# Patient Record
Sex: Female | Born: 1950
Health system: Southern US, Community
[De-identification: ages and names within clinical notes are randomized; demographics above are authoritative.]

## PROBLEM LIST (undated history)

## (undated) DIAGNOSIS — R7303 Prediabetes: Secondary | ICD-10-CM

## (undated) DIAGNOSIS — R519 Headache, unspecified: Secondary | ICD-10-CM

## (undated) DIAGNOSIS — F411 Generalized anxiety disorder: Secondary | ICD-10-CM

## (undated) DIAGNOSIS — E039 Hypothyroidism, unspecified: Secondary | ICD-10-CM

## (undated) DIAGNOSIS — K219 Gastro-esophageal reflux disease without esophagitis: Secondary | ICD-10-CM

## (undated) DIAGNOSIS — E119 Type 2 diabetes mellitus without complications: Secondary | ICD-10-CM

## (undated) DIAGNOSIS — N189 Chronic kidney disease, unspecified: Secondary | ICD-10-CM

## (undated) DIAGNOSIS — R251 Tremor, unspecified: Secondary | ICD-10-CM

## (undated) DIAGNOSIS — C801 Malignant (primary) neoplasm, unspecified: Secondary | ICD-10-CM

## (undated) DIAGNOSIS — J189 Pneumonia, unspecified organism: Secondary | ICD-10-CM

## (undated) DIAGNOSIS — J45909 Unspecified asthma, uncomplicated: Secondary | ICD-10-CM

## (undated) DIAGNOSIS — I10 Essential (primary) hypertension: Secondary | ICD-10-CM

## (undated) DIAGNOSIS — R51 Headache: Secondary | ICD-10-CM

## (undated) DIAGNOSIS — F419 Anxiety disorder, unspecified: Secondary | ICD-10-CM

## (undated) DIAGNOSIS — M1990 Unspecified osteoarthritis, unspecified site: Secondary | ICD-10-CM

## (undated) DIAGNOSIS — K2270 Barrett's esophagus without dysplasia: Secondary | ICD-10-CM

## (undated) HISTORY — DX: Gastro-esophageal reflux disease without esophagitis: K21.9

## (undated) HISTORY — DX: Hypothyroidism, unspecified: E03.9

## (undated) HISTORY — DX: Anxiety disorder, unspecified: F41.9

## (undated) HISTORY — DX: Chronic kidney disease, unspecified: N18.9

## (undated) HISTORY — DX: Tremor, unspecified: R25.1

## (undated) HISTORY — DX: Headache, unspecified: R51.9

## (undated) HISTORY — PX: TONSILLECTOMY: SUR1361

## (undated) HISTORY — PX: ESOPHAGOGASTRODUODENOSCOPY ENDOSCOPY: SHX5814

## (undated) HISTORY — PX: COLONOSCOPY: SHX174

## (undated) HISTORY — DX: Generalized anxiety disorder: F41.1

## (undated) HISTORY — DX: Unspecified asthma, uncomplicated: J45.909

## (undated) HISTORY — DX: Essential (primary) hypertension: I10

## (undated) HISTORY — DX: Headache: R51

## (undated) HISTORY — DX: Type 2 diabetes mellitus without complications: E11.9

## (undated) HISTORY — DX: Barrett's esophagus without dysplasia: K22.70

## (undated) SURGERY — Surgical Case
Anesthesia: *Unknown

---

## 1996-10-20 HISTORY — PX: CHOLECYSTECTOMY: SHX55

## 2007-06-16 ENCOUNTER — Ambulatory Visit: Payer: Self-pay | Admitting: Oncology

## 2014-11-20 DIAGNOSIS — Z01419 Encounter for gynecological examination (general) (routine) without abnormal findings: Secondary | ICD-10-CM | POA: Diagnosis not present

## 2014-11-20 DIAGNOSIS — M109 Gout, unspecified: Secondary | ICD-10-CM | POA: Diagnosis not present

## 2015-01-05 DIAGNOSIS — M25569 Pain in unspecified knee: Secondary | ICD-10-CM | POA: Diagnosis not present

## 2015-01-22 DIAGNOSIS — H6983 Other specified disorders of Eustachian tube, bilateral: Secondary | ICD-10-CM | POA: Diagnosis not present

## 2015-02-26 DIAGNOSIS — M1712 Unilateral primary osteoarthritis, left knee: Secondary | ICD-10-CM | POA: Diagnosis not present

## 2015-02-26 DIAGNOSIS — M1711 Unilateral primary osteoarthritis, right knee: Secondary | ICD-10-CM | POA: Diagnosis not present

## 2015-02-26 DIAGNOSIS — M170 Bilateral primary osteoarthritis of knee: Secondary | ICD-10-CM | POA: Diagnosis not present

## 2015-03-01 DIAGNOSIS — Z1389 Encounter for screening for other disorder: Secondary | ICD-10-CM | POA: Diagnosis not present

## 2015-03-01 DIAGNOSIS — Z9181 History of falling: Secondary | ICD-10-CM | POA: Diagnosis not present

## 2015-03-01 DIAGNOSIS — N951 Menopausal and female climacteric states: Secondary | ICD-10-CM | POA: Diagnosis not present

## 2015-03-28 DIAGNOSIS — R0602 Shortness of breath: Secondary | ICD-10-CM | POA: Diagnosis not present

## 2015-03-28 DIAGNOSIS — J40 Bronchitis, not specified as acute or chronic: Secondary | ICD-10-CM | POA: Diagnosis not present

## 2015-03-29 DIAGNOSIS — R0609 Other forms of dyspnea: Secondary | ICD-10-CM | POA: Diagnosis not present

## 2015-03-29 DIAGNOSIS — R0600 Dyspnea, unspecified: Secondary | ICD-10-CM | POA: Diagnosis not present

## 2015-04-03 DIAGNOSIS — F411 Generalized anxiety disorder: Secondary | ICD-10-CM | POA: Diagnosis not present

## 2015-04-06 DIAGNOSIS — N179 Acute kidney failure, unspecified: Secondary | ICD-10-CM | POA: Diagnosis not present

## 2015-04-06 DIAGNOSIS — E119 Type 2 diabetes mellitus without complications: Secondary | ICD-10-CM | POA: Diagnosis present

## 2015-04-06 DIAGNOSIS — R031 Nonspecific low blood-pressure reading: Secondary | ICD-10-CM | POA: Diagnosis not present

## 2015-04-06 DIAGNOSIS — Z79899 Other long term (current) drug therapy: Secondary | ICD-10-CM | POA: Diagnosis not present

## 2015-04-06 DIAGNOSIS — M1A3690 Chronic gout due to renal impairment, unspecified knee, without tophus (tophi): Secondary | ICD-10-CM | POA: Diagnosis present

## 2015-04-06 DIAGNOSIS — Z888 Allergy status to other drugs, medicaments and biological substances status: Secondary | ICD-10-CM | POA: Diagnosis not present

## 2015-04-06 DIAGNOSIS — R55 Syncope and collapse: Secondary | ICD-10-CM | POA: Diagnosis not present

## 2015-04-06 DIAGNOSIS — E871 Hypo-osmolality and hyponatremia: Secondary | ICD-10-CM | POA: Diagnosis not present

## 2015-04-06 DIAGNOSIS — E869 Volume depletion, unspecified: Secondary | ICD-10-CM | POA: Diagnosis present

## 2015-04-06 DIAGNOSIS — N183 Chronic kidney disease, stage 3 (moderate): Secondary | ICD-10-CM | POA: Diagnosis present

## 2015-04-06 DIAGNOSIS — Z880 Allergy status to penicillin: Secondary | ICD-10-CM | POA: Diagnosis not present

## 2015-04-06 DIAGNOSIS — E860 Dehydration: Secondary | ICD-10-CM | POA: Diagnosis not present

## 2015-04-06 DIAGNOSIS — I959 Hypotension, unspecified: Secondary | ICD-10-CM | POA: Diagnosis not present

## 2015-04-06 DIAGNOSIS — J069 Acute upper respiratory infection, unspecified: Secondary | ICD-10-CM | POA: Diagnosis not present

## 2015-04-06 DIAGNOSIS — I129 Hypertensive chronic kidney disease with stage 1 through stage 4 chronic kidney disease, or unspecified chronic kidney disease: Secondary | ICD-10-CM | POA: Diagnosis not present

## 2015-04-06 DIAGNOSIS — E039 Hypothyroidism, unspecified: Secondary | ICD-10-CM | POA: Diagnosis present

## 2015-04-16 DIAGNOSIS — R300 Dysuria: Secondary | ICD-10-CM | POA: Diagnosis not present

## 2015-04-16 DIAGNOSIS — N189 Chronic kidney disease, unspecified: Secondary | ICD-10-CM | POA: Diagnosis not present

## 2015-04-16 DIAGNOSIS — S37009A Unspecified injury of unspecified kidney, initial encounter: Secondary | ICD-10-CM | POA: Diagnosis not present

## 2015-04-16 DIAGNOSIS — E860 Dehydration: Secondary | ICD-10-CM | POA: Diagnosis not present

## 2015-05-07 DIAGNOSIS — I10 Essential (primary) hypertension: Secondary | ICD-10-CM | POA: Diagnosis not present

## 2015-05-11 DIAGNOSIS — M25569 Pain in unspecified knee: Secondary | ICD-10-CM | POA: Diagnosis not present

## 2015-05-24 DIAGNOSIS — D510 Vitamin B12 deficiency anemia due to intrinsic factor deficiency: Secondary | ICD-10-CM | POA: Diagnosis not present

## 2015-05-29 DIAGNOSIS — Z1231 Encounter for screening mammogram for malignant neoplasm of breast: Secondary | ICD-10-CM | POA: Diagnosis not present

## 2015-06-11 DIAGNOSIS — M25562 Pain in left knee: Secondary | ICD-10-CM | POA: Diagnosis not present

## 2015-06-29 DIAGNOSIS — E119 Type 2 diabetes mellitus without complications: Secondary | ICD-10-CM | POA: Diagnosis not present

## 2015-06-29 DIAGNOSIS — E039 Hypothyroidism, unspecified: Secondary | ICD-10-CM | POA: Diagnosis not present

## 2015-06-29 DIAGNOSIS — Z79899 Other long term (current) drug therapy: Secondary | ICD-10-CM | POA: Diagnosis not present

## 2015-07-27 DIAGNOSIS — D510 Vitamin B12 deficiency anemia due to intrinsic factor deficiency: Secondary | ICD-10-CM | POA: Diagnosis not present

## 2015-08-06 DIAGNOSIS — G43009 Migraine without aura, not intractable, without status migrainosus: Secondary | ICD-10-CM | POA: Diagnosis not present

## 2015-08-16 DIAGNOSIS — Z23 Encounter for immunization: Secondary | ICD-10-CM | POA: Diagnosis not present

## 2015-09-03 DIAGNOSIS — E119 Type 2 diabetes mellitus without complications: Secondary | ICD-10-CM | POA: Diagnosis not present

## 2015-09-03 DIAGNOSIS — N183 Chronic kidney disease, stage 3 (moderate): Secondary | ICD-10-CM | POA: Diagnosis not present

## 2015-09-03 DIAGNOSIS — M150 Primary generalized (osteo)arthritis: Secondary | ICD-10-CM | POA: Diagnosis not present

## 2015-09-11 DIAGNOSIS — D510 Vitamin B12 deficiency anemia due to intrinsic factor deficiency: Secondary | ICD-10-CM | POA: Diagnosis not present

## 2015-10-15 DIAGNOSIS — R509 Fever, unspecified: Secondary | ICD-10-CM | POA: Diagnosis not present

## 2015-10-15 DIAGNOSIS — R05 Cough: Secondary | ICD-10-CM | POA: Diagnosis not present

## 2015-10-17 DIAGNOSIS — J189 Pneumonia, unspecified organism: Secondary | ICD-10-CM | POA: Diagnosis not present

## 2015-10-23 DIAGNOSIS — E860 Dehydration: Secondary | ICD-10-CM | POA: Diagnosis not present

## 2015-10-23 DIAGNOSIS — J189 Pneumonia, unspecified organism: Secondary | ICD-10-CM | POA: Diagnosis not present

## 2015-10-23 DIAGNOSIS — N179 Acute kidney failure, unspecified: Secondary | ICD-10-CM | POA: Diagnosis not present

## 2015-10-23 DIAGNOSIS — R509 Fever, unspecified: Secondary | ICD-10-CM | POA: Diagnosis not present

## 2015-10-24 DIAGNOSIS — E860 Dehydration: Secondary | ICD-10-CM | POA: Diagnosis not present

## 2015-10-24 DIAGNOSIS — J329 Chronic sinusitis, unspecified: Secondary | ICD-10-CM | POA: Diagnosis not present

## 2015-10-24 DIAGNOSIS — N179 Acute kidney failure, unspecified: Secondary | ICD-10-CM | POA: Diagnosis not present

## 2015-10-25 DIAGNOSIS — J329 Chronic sinusitis, unspecified: Secondary | ICD-10-CM | POA: Diagnosis not present

## 2015-10-26 DIAGNOSIS — D510 Vitamin B12 deficiency anemia due to intrinsic factor deficiency: Secondary | ICD-10-CM | POA: Diagnosis not present

## 2015-10-26 DIAGNOSIS — J329 Chronic sinusitis, unspecified: Secondary | ICD-10-CM | POA: Diagnosis not present

## 2015-10-29 DIAGNOSIS — J329 Chronic sinusitis, unspecified: Secondary | ICD-10-CM | POA: Diagnosis not present

## 2015-10-30 DIAGNOSIS — J329 Chronic sinusitis, unspecified: Secondary | ICD-10-CM | POA: Diagnosis not present

## 2015-11-23 DIAGNOSIS — N390 Urinary tract infection, site not specified: Secondary | ICD-10-CM | POA: Diagnosis not present

## 2015-12-13 DIAGNOSIS — S61259A Open bite of unspecified finger without damage to nail, initial encounter: Secondary | ICD-10-CM | POA: Diagnosis not present

## 2015-12-13 DIAGNOSIS — L089 Local infection of the skin and subcutaneous tissue, unspecified: Secondary | ICD-10-CM | POA: Diagnosis not present

## 2015-12-13 DIAGNOSIS — W5501XA Bitten by cat, initial encounter: Secondary | ICD-10-CM | POA: Diagnosis not present

## 2015-12-13 DIAGNOSIS — S61432A Puncture wound without foreign body of left hand, initial encounter: Secondary | ICD-10-CM | POA: Diagnosis not present

## 2015-12-28 DIAGNOSIS — J342 Deviated nasal septum: Secondary | ICD-10-CM | POA: Diagnosis not present

## 2015-12-28 DIAGNOSIS — J329 Chronic sinusitis, unspecified: Secondary | ICD-10-CM | POA: Diagnosis not present

## 2015-12-28 DIAGNOSIS — R05 Cough: Secondary | ICD-10-CM | POA: Diagnosis not present

## 2015-12-28 DIAGNOSIS — J343 Hypertrophy of nasal turbinates: Secondary | ICD-10-CM | POA: Diagnosis not present

## 2015-12-28 DIAGNOSIS — J069 Acute upper respiratory infection, unspecified: Secondary | ICD-10-CM | POA: Diagnosis not present

## 2015-12-28 DIAGNOSIS — R51 Headache: Secondary | ICD-10-CM | POA: Diagnosis not present

## 2015-12-28 DIAGNOSIS — R0981 Nasal congestion: Secondary | ICD-10-CM | POA: Diagnosis not present

## 2016-01-01 DIAGNOSIS — J329 Chronic sinusitis, unspecified: Secondary | ICD-10-CM | POA: Diagnosis not present

## 2016-01-01 DIAGNOSIS — J342 Deviated nasal septum: Secondary | ICD-10-CM | POA: Diagnosis not present

## 2016-01-04 DIAGNOSIS — R112 Nausea with vomiting, unspecified: Secondary | ICD-10-CM | POA: Diagnosis not present

## 2016-01-04 DIAGNOSIS — K219 Gastro-esophageal reflux disease without esophagitis: Secondary | ICD-10-CM | POA: Diagnosis not present

## 2016-01-10 DIAGNOSIS — R112 Nausea with vomiting, unspecified: Secondary | ICD-10-CM | POA: Diagnosis not present

## 2016-01-11 DIAGNOSIS — R51 Headache: Secondary | ICD-10-CM | POA: Diagnosis not present

## 2016-01-11 DIAGNOSIS — J342 Deviated nasal septum: Secondary | ICD-10-CM | POA: Diagnosis not present

## 2016-01-11 DIAGNOSIS — R05 Cough: Secondary | ICD-10-CM | POA: Diagnosis not present

## 2016-01-11 DIAGNOSIS — K219 Gastro-esophageal reflux disease without esophagitis: Secondary | ICD-10-CM | POA: Diagnosis not present

## 2016-01-14 ENCOUNTER — Ambulatory Visit (INDEPENDENT_AMBULATORY_CARE_PROVIDER_SITE_OTHER): Payer: Medicare Other | Admitting: Pulmonary Disease

## 2016-01-14 ENCOUNTER — Encounter: Payer: Self-pay | Admitting: Pulmonary Disease

## 2016-01-14 VITALS — BP 124/84 | HR 79 | Temp 96.9°F

## 2016-01-14 DIAGNOSIS — R05 Cough: Secondary | ICD-10-CM | POA: Diagnosis not present

## 2016-01-14 DIAGNOSIS — K219 Gastro-esophageal reflux disease without esophagitis: Secondary | ICD-10-CM

## 2016-01-14 DIAGNOSIS — R053 Chronic cough: Secondary | ICD-10-CM

## 2016-01-14 DIAGNOSIS — J387 Other diseases of larynx: Secondary | ICD-10-CM

## 2016-01-14 MED ORDER — OMEPRAZOLE 40 MG PO CPDR: 40.0000 mg | DELAYED_RELEASE_CAPSULE | Freq: Two times a day (BID) | ORAL | Status: AC

## 2016-01-14 NOTE — Patient Instructions (Signed)
Mee-- it was great meeting you today...  With your min remote smoking history, clear CXRs in Dec & Jan, normal spirometry & oximetry tests>    It is unlikely that you have any serious underlying lung disease...  The most likely cause of your refractory cough with severe paroxysms that lead to vomiting is a type of airway inflammation related to reflux that often occurs at night...  We are going to place you on a strict ANTIREFLUX regimen for the next 6wks-    1) take the OMEPRAZOLE 40mg  TWICE daily= 30 min before breakfast & dinner in the eve (it is important to eat at least a little bit after the 39minutes)    2) DO NOT EAT OR DRINK AFTER DINNER IN THE EVENING    3) ELEVATE THE HEAD OF YOUR BED ON ^" BLOCKS  Continue to use the SYMBICORT- 2 sprays twice daily for now (and rinse out your mouth after each treatment)  Continue to use cough drops, throat lozenges, sugarless gum, sip water, etc...  Call for any questions...  Let's plan a follow up visit in 6wks, sooner if needed for problems.Marland KitchenMarland Kitchen

## 2016-01-14 NOTE — Progress Notes (Signed)
Subjective:     Patient ID: Christina Garner, female   DOB: 11-10-1950, 65 y.o.   MRN: [id number]  HPI ~  January 14, 2016:  Initial pulmonary evaluation by SN>   34 y/o WF referred by DrBurkhart, [facility] in [place] for a pulmonary evaluation- she is their PCC>      Christina Garner presents w/ a hx of persistent cough- moderate in severity, present for about 3-63mo and started w/ "a cold" she says; cough occurs in paroxysms that can be severe and lead to vomiting, assoc w/ some thick white mucus, no discoloration or blood, denies SOB, +hoarseness and ENT eval c/w LPR; seen at an urgent care over the holidays- CXR clear, given Rocephin & ZPak;  Then evaluated and treated by DrBurkhart- given Rocephin, Omnicef, Prednisone taper, Levaquin (poorly tolerated), IVF, etc;  Sent to ENT (Dr. Cletis [facility]) earlier this month w/ thorough eval (CT Sinus- neg, and Laryngoscopy w/ signs of LPR) & findings of recurrent URIs, chr facial pain, dev septum, hypertrophy of inferior turbinates, & LPR;  Started on Omep40, antireflux regimen, nasal saline mist...   Smoking Hx>  She is an ex-smoker (smoked 1 pack total in her high school days, min in college, 1ppwk & quit in [place]- at age 63)  Pulmonary Hx>  No real hx of lung dis/ resp problems- no hx asthma but she's had bronchitis once per yr & one prev bout of pneumonia; no hx TB or known exposure and neg PPDs in their medical office...  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  Last CXRs>  10/15/15 & 10/23/15 Film reports state norm heart size, clear lungs, NAD...  CT Sinuses 01/01/16 at [place]> calcif atherosclerosis at skull base, mild mucosal thickening in max sinus, nasal sepatal deviation & spurring, no CT evid of sinusitis...  Spirometry 01/14/16>  FVC=3.35 (104%), FEV1=2.61 (106%), %1sec=78%, mid-flows wnl at 107% predicted... NORMAL SPIROMETRY  Ambulatory Oximetry 01/14/16>  O2sat= 100% on RA at rest w/ pulse=80/min;  She ambulated 3 Laps in the office w/ lowest O2sat=96% w/ pulse=108/min... NO DESATURATION  IMP >>     Chronic cough-- likely upper airway cough syndrome>  OK to continue the Symbicort Bid, use lozenges/ cough drops/ sugarless gum/ OTC cough syrup prn...    LPR>  This was identified by ENT & they started PPI but needs to take this 68min before dinner + a vigorous antireflux regimen (NPO after dinner, elev HOB 6")...  PLAN >>     Christina Garner has had an excellent work up to date but unfortunately min improvement w/ mult antibiotics, Pred taper, Symbicort, etc;  We discussed the upper airway inflamm & it's relation to reflux/ LPR and we reviewed the treatment regimen w/ PPI taken 30 min before dinner, NPO after dinner, elev HOB 6" blocks;  She will use cough drops/ throat lozenges/ sugarless gum/ etc and give  this some time- we plan ROV recheck in 6 weeks...     No past medical history on file.   No past surgical history on file.   Outpatient Encounter Prescriptions as of 01/14/2016  Medication Sig  . ALPRAZolam (XANAX) 1 MG tablet Take 1 mg by mouth at bedtime as needed.  . budesonide-formoterol (SYMBICORT) 160-4.5 MCG/ACT inhaler Inhale 2 puffs into the lungs 2 (two) times daily.  . cyanocobalamin (,VITAMIN B-12,) 1000 MCG/ML injection Inject 1,000 mcg into the muscle every 30 (thirty) days.  . furosemide  (LASIX) 40 MG tablet Take 40 mg by mouth daily as needed.  Marland Kitchen ibuprofen (ADVIL,MOTRIN) 800 MG tablet Take 800 mg by mouth 2 (two) times daily as needed.  Marland Kitchen levothyroxine (SYNTHROID, LEVOTHROID) 112 MCG tablet Take 112 mcg by mouth daily before breakfast.  . promethazine (PHENERGAN) 25 MG tablet Take 25 mg by mouth every 6 (six) hours as needed for nausea or vomiting.  . SUMAtriptan (IMITREX) 100 MG tablet Take 100 mg by mouth every 2 (two) hours as needed for migraine. May repeat in 2 hours if headache persists or recurs.  . valsartan (DIOVAN) 320 MG tablet Take 320 mg by mouth daily.  Marland Kitchen venlafaxine XR (EFFEXOR-XR) 150 MG 24 hr capsule Take 150 mg by mouth daily.  Marland Kitchen omeprazole (PRILOSEC) 40 MG capsule Take 1 capsule (40 mg total) by mouth 2 (two) times daily. 30MINS BEFORE BREAKFAST AND DINNER   No facility-administered encounter medications on file as of 01/14/2016.    Not on File   No family history on file.   Social History   Social History  . Marital Status: Divorced    Spouse Name: N/A  . Number of Children: N/A  . Years of Education: N/A   Occupational History  . Not on file.   Social History Main Topics  . Smoking status: Former Smoker -- 0.50 packs/day for 3 years    Types: Cigarettes    Start date: 10/21/1967    Quit date: 10/20/1969  . Smokeless tobacco: Never Used  . Alcohol Use: Not on file  . Drug Use: Not on file  . Sexual Activity: Not on file   Other Topics Concern  . Not on file   Social History Narrative  . No narrative on file    Current Medications, Allergies, Past Medical History, Past Surgical History, Family History, and Social History were reviewed in [employer] record.   Review of Systems             All symptoms NEG except where BOLDED >>  Constitutional:  F/C/S, fatigue, anorexia, unexpected weight change. HEENT:  HA, visual changes, hearing loss, earache, nasal symptoms, sore throat, mouth sores, hoarseness. Resp:   cough, sputum, hemoptysis; SOB, tightness, wheezing. Cardio:  CP, palpit, DOE, orthopnea, edema. GI:  N/V/D/C, blood in stool; reflux, abd pain, distention, gas. GU:  dysuria, freq, urgency, hematuria, flank pain, voiding difficulty. MS:  joint pain, swelling, tenderness, decr ROM; neck pain, back pain, etc. Neuro:  HA, tremors, seizures, dizziness, syncope, weakness, numbness, gait abn. Skin:  suspicious lesions or skin rash. Heme:  adenopathy, bruising, bleeding. Psyche:  confusion, agitation, sleep disturbance, hallucinations, anxiety, depression suicidal.   Objective:   Physical Exam       Vital Signs:  Reviewed...  General:  WD, overweight, 65 y/o WF in NAD; alert & oriented; pleasant & cooperative... HEENT:  [place]/AT; Conjunctiva- pink, Sclera- nonicteric, EOM-wnl, PERRLA, EACs-clear, TMs-wnl; NOSE-clear; THROAT-sl red w/o exud. Neck:  Supple  w/ fair ROM; no JVD; normal carotid impulses w/o bruits; no thyromegaly or nodules palpated; no lymphadenopathy. Chest:  Clear to P & A; without wheezes, rales, or rhonchi heard. Heart:  Regular Rhythm; norm S1 & S2 without murmurs, rubs, or gallops detected. Abdomen:  Soft & nontender- no guarding or rebound; normal bowel sounds; no organomegaly or masses palpated. Ext:  Decr ROM; without deformities +arthritic changes; no varicose veins, +venous insuffic, no edema;  Pulses intact w/o bruits. Neuro:  CNs II-XII intact; motor testing normal; sensory testing normal; gait normal & balance OK. Derm:  No lesions noted; no rash etc. Lymph:  No cervical, supraclavicular, axillary, or inguinal adenopathy palpated.   Assessment:      IMP >>     Chronic cough-- likely upper airway cough syndrome>  OK to continue the Symbicort Bid, use lozenges/ cough drops/ sugarless gum/ OTC cough syrup prn...    LPR>  This was identified by ENT & they started PPI but needs to take this 66min before dinner + a vigorous antireflux regimen (NPO after dinner, elev HOB  6")...  PLAN >>     Christina Garner has had an excellent work up to date but unfortunately min improvement w/ mult antibiotics, Pred taper, Symbicort, etc;  We discussed the upper airway inflamm & it's relation to reflux/ LPR and we reviewed the treatment regimen w/ PPI taken 30 min before dinner, NPO after dinner, elev HOB 6" blocks;  She will use cough drops/ throat lozenges/ sugarless gum/ etc and give this some time- we plan ROV recheck in 6 weeks...      Plan:      Patient's Medications  New Prescriptions   OMEPRAZOLE (PRILOSEC) 40 MG CAPSULE    Take 1 capsule (40 mg total) by mouth 2 (two) times daily. 30MINS BEFORE BREAKFAST AND DINNER  Previous Medications   ALPRAZOLAM (XANAX) 1 MG TABLET    Take 1 mg by mouth at bedtime as needed.   BUDESONIDE-FORMOTEROL (SYMBICORT) 160-4.5 MCG/ACT INHALER    Inhale 2 puffs into the lungs 2 (two) times daily.   CYANOCOBALAMIN (,VITAMIN B-12,) 1000 MCG/ML INJECTION    Inject 1,000 mcg into the muscle every 30 (thirty) days.   FUROSEMIDE (LASIX) 40 MG TABLET    Take 40 mg by mouth daily as needed.   IBUPROFEN (ADVIL,MOTRIN) 800 MG TABLET    Take 800 mg by mouth 2 (two) times daily as needed.   LEVOTHYROXINE (SYNTHROID, LEVOTHROID) 112 MCG TABLET    Take 112 mcg by mouth daily before breakfast.   PROMETHAZINE (PHENERGAN) 25 MG TABLET    Take 25 mg by mouth every 6 (six) hours as needed for nausea or vomiting.   SUMATRIPTAN (IMITREX) 100 MG TABLET    Take 100 mg by mouth every 2 (two) hours as needed for migraine. May repeat in 2 hours if headache persists or recurs.   VALSARTAN (DIOVAN) 320 MG TABLET    Take 320 mg by mouth daily.   VENLAFAXINE XR (EFFEXOR-XR) 150 MG 24 HR CAPSULE    Take 150 mg by mouth daily.  Modified Medications   No medications on file  Discontinued Medications   No medications on file

## 2016-01-29 DIAGNOSIS — H2513 Age-related nuclear cataract, bilateral: Secondary | ICD-10-CM | POA: Diagnosis not present

## 2016-02-27 ENCOUNTER — Ambulatory Visit: Payer: Medicare Other | Admitting: Pulmonary Disease

## 2016-02-27 DIAGNOSIS — R05 Cough: Secondary | ICD-10-CM

## 2016-02-27 DIAGNOSIS — J387 Other diseases of larynx: Secondary | ICD-10-CM

## 2016-02-27 DIAGNOSIS — R053 Chronic cough: Secondary | ICD-10-CM

## 2016-02-27 DIAGNOSIS — K219 Gastro-esophageal reflux disease without esophagitis: Secondary | ICD-10-CM

## 2016-02-27 NOTE — Patient Instructions (Signed)
Today we updated your med list in our EPIC system...    Continue your current medications the same...    Continue the VIGOROUS antireflux regimen>       Do not eat or drink after dinner in the eve...       Elevate the head of your bed at least 6" on blocks...  We discussed the next step>  A thorough GI eval from DrMeisenheimer in [place] to check for a Hiatus Hernia, check the lower esophageal sphincter, etc...  Call for any questions...  Let's plan a follow up visit in 76mo, sooner if needed for problems.Marland KitchenMarland Kitchen

## 2016-02-28 ENCOUNTER — Encounter: Payer: Self-pay | Admitting: Pulmonary Disease

## 2016-02-28 NOTE — Progress Notes (Signed)
Subjective:     Patient ID: Christina Garner, female   DOB: 09/07/1951, 65 y.o.   MRN: [id number]  HPI  ~  January 14, 2016:  Initial pulmonary evaluation by SN>   38 y/o WF referred by DrBurkhart, [facility] in [place] for a pulmonary evaluation- she is their PCC>      Christina Garner presents w/ a hx of persistent cough- moderate in severity, present for about 3-21mo and started w/ "a cold" she says; cough occurs in paroxysms that can be severe and lead to vomiting, assoc w/ some thick white mucus, no discoloration or blood, denies SOB, +hoarseness and ENT eval c/w LPR; seen at an urgent care over the holidays- CXR clear, given Rocephin & ZPak;  Then evaluated and treated by DrBurkhart- given Rocephin, Omnicef, Prednisone taper, Levaquin (poorly tolerated), IVF, etc;  Sent to ENT (Dr. Cletis [facility]) earlier this month w/ thorough eval (CT Sinus- neg, and Laryngoscopy w/ signs of LPR) & findings of recurrent URIs, chr facial pain, dev septum, hypertrophy of inferior turbinates, & LPR;  Started on Omep40, antireflux regimen, nasal saline mist...   Smoking Hx>  She is an ex-smoker (smoked 1 pack total in her high school days, min in college, 1ppwk & quit in [place]- at age 55)  Pulmonary Hx>  No real hx of lung dis/ resp problems- no hx asthma but she's had bronchitis once per yr & one prev bout of pneumonia; no hx TB or known exposure and neg PPDs in their medical office...  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  Last CXRs>  10/15/15 & 10/23/15 Film reports state norm heart size, clear lungs, NAD...  CT Sinuses 01/01/16 at [place]> calcif atherosclerosis at skull base, mild mucosal thickening in max sinus, nasal sepatal deviation & spurring, no CT evid of sinusitis...  Spirometry 01/14/16>  FVC=3.35 (104%), FEV1=2.61 (106%), %1sec=78%, mid-flows wnl at 107% predicted... NORMAL SPIROMETRY  Ambulatory Oximetry 01/14/16>  O2sat= 100% on RA at rest w/ pulse=80/min;  She ambulated 3 Laps in the office w/ lowest O2sat=96% w/ pulse=108/min... NO DESATURATION IMP >>     Chronic cough-- likely upper airway cough syndrome>  OK to continue the Symbicort Bid, use lozenges/ cough drops/ sugarless gum/ OTC cough syrup prn...    LPR>  This was identified by ENT & they started PPI but needs to take this 74min before dinner + a vigorous antireflux regimen (NPO after dinner, elev HOB 6")... PLAN >>     Christina Garner has had an excellent work up to date but unfortunately min improvement w/ mult antibiotics, Pred taper, Symbicort, etc;  We discussed the upper airway inflamm & it's relation to reflux/ LPR and we reviewed the treatment regimen w/ PPI taken 30 min before dinner, NPO after dinner, elev HOB 6" blocks;  She will use cough drops/ throat lozenges/ sugarless gum/ etc and give this some  time- we plan ROV recheck in 6 weeks...   ~  Feb 27, 2016:  6wk ROV w/ SN>       No past medical history on file.   No past surgical history on file.   Outpatient Encounter Prescriptions as of 02/27/2016  Medication Sig  . ALPRAZolam (XANAX) 1 MG tablet Take 1 mg by mouth at bedtime as needed.  . budesonide-formoterol (SYMBICORT) 160-4.5 MCG/ACT inhaler Inhale 2 puffs into the lungs 2 (two) times daily.  . cyanocobalamin (,VITAMIN B-12,) 1000 MCG/ML injection Inject 1,000 mcg into the muscle every  30 (thirty) days.  . furosemide (LASIX) 40 MG tablet Take 40 mg by mouth daily as needed.  Marland Kitchen ibuprofen (ADVIL,MOTRIN) 800 MG tablet Take 800 mg by mouth 2 (two) times daily as needed.  Marland Kitchen levothyroxine (SYNTHROID, LEVOTHROID) 112 MCG tablet Take 112 mcg by mouth daily before breakfast.  . omeprazole (PRILOSEC) 40 MG capsule Take 1 capsule (40 mg total) by mouth 2 (two) times daily. 30MINS BEFORE BREAKFAST AND DINNER  . promethazine (PHENERGAN) 25 MG tablet Take 25 mg by mouth every 6 (six) hours as needed for nausea or vomiting.  . SUMAtriptan (IMITREX) 100 MG tablet Take 100 mg by mouth every 2 (two) hours as needed for migraine. May repeat in 2 hours if headache persists or recurs.  . valsartan (DIOVAN) 320 MG tablet Take 320 mg by mouth daily.  Marland Kitchen venlafaxine XR (EFFEXOR-XR) 150 MG 24 hr capsule Take 150 mg by mouth daily.   No facility-administered encounter medications on file as of 02/27/2016.    Allergies  Allergen Reactions  . Penicillins     rash    No family history on file.   Social History   Social History  . Marital Status: Divorced    Spouse Name: N/A  . Number of Children: N/A  . Years of Education: N/A   Occupational History  . Not on file.   Social History Main Topics  . Smoking status: Former Smoker -- 0.50 packs/day for 3 years    Types: Cigarettes    Start date: 10/21/1967    Quit date: 10/20/1969  . Smokeless tobacco: Never Used  . Alcohol Use: Not on file  . Drug Use: Not on file  . Sexual Activity: Not on file   Other Topics Concern  . Not on file   Social History Narrative    Current Medications, Allergies, Past Medical History, Past Surgical History, Family History, and Social History were reviewed in [employer] record.   Review of Systems             All symptoms NEG except where BOLDED >>  Constitutional:  F/C/S, fatigue, anorexia, unexpected weight change. HEENT:  HA, visual changes, hearing loss, earache, nasal  symptoms, sore throat, mouth sores, hoarseness. Resp:  cough, sputum, hemoptysis; SOB, tightness, wheezing. Cardio:  CP, palpit, DOE, orthopnea, edema. GI:  N/V/D/C, blood in stool; reflux, abd pain, distention, gas. GU:  dysuria, freq, urgency, hematuria, flank pain, voiding difficulty. MS:  joint pain, swelling, tenderness, decr ROM; neck pain, back pain, etc. Neuro:  HA, tremors, seizures, dizziness, syncope, weakness, numbness, gait abn. Skin:  suspicious lesions or skin rash. Heme:  adenopathy, bruising, bleeding. Psyche:  confusion, agitation, sleep disturbance, hallucinations, anxiety, depression suicidal.   Objective:   Physical Exam       Vital Signs:  Reviewed...  General:  WD, overweight, 65 y/o WF in NAD; alert & oriented; pleasant & cooperative... HEENT:  [place]/AT;  Conjunctiva- pink, Sclera- nonicteric, EOM-wnl, PERRLA, EACs-clear, TMs-wnl; NOSE-clear; THROAT-sl red w/o exud. Neck:  Supple w/ fair ROM; no JVD; normal carotid impulses w/o bruits; no thyromegaly or nodules palpated; no lymphadenopathy. Chest:  Clear to P & A; without wheezes, rales, or rhonchi heard. Heart:  Regular Rhythm; norm S1 & S2 without murmurs, rubs, or gallops detected. Abdomen:  Soft & nontender- no guarding or rebound; normal bowel sounds; no organomegaly or masses palpated. Ext:  Decr ROM; without deformities +arthritic changes; no varicose veins, +venous insuffic, no edema;  Pulses intact w/o bruits. Neuro:  CNs II-XII intact; motor testing normal; sensory testing normal; gait normal & balance OK. Derm:  No lesions noted; no rash etc. Lymph:  No cervical, supraclavicular, axillary, or inguinal adenopathy palpated.   Assessment:      IMP >>     Chronic cough-- likely upper airway cough syndrome>  OK to continue the Symbicort Bid, use lozenges/ cough drops/ sugarless gum/ OTC cough syrup prn...    LPR>  This was identified by ENT & they started PPI but needs to take this 2min before dinner + a  vigorous antireflux regimen (NPO after dinner, elev HOB 6")...  PLAN >>     Christina Garner has had an excellent work up to date but unfortunately min improvement w/ mult antibiotics, Pred taper, Symbicort, etc;  We discussed the upper airway inflamm & it's relation to reflux/ LPR and we reviewed the treatment regimen w/ PPI taken 30 min before dinner, NPO after dinner, elev HOB 6" blocks;  She will use cough drops/ throat lozenges/ sugarless gum/ etc and give this some time- we plan ROV recheck in 6 weeks...      Plan:      Patient's Medications  New Prescriptions   No medications on file  Previous Medications   ALPRAZOLAM (XANAX) 1 MG TABLET    Take 1 mg by mouth at bedtime as needed.   BUDESONIDE-FORMOTEROL (SYMBICORT) 160-4.5 MCG/ACT INHALER    Inhale 2 puffs into the lungs 2 (two) times daily.   CYANOCOBALAMIN (,VITAMIN B-12,) 1000 MCG/ML INJECTION    Inject 1,000 mcg into the muscle every 30 (thirty) days.   FUROSEMIDE (LASIX) 40 MG TABLET    Take 40 mg by mouth daily as needed.   IBUPROFEN (ADVIL,MOTRIN) 800 MG TABLET    Take 800 mg by mouth 2 (two) times daily as needed.   LEVOTHYROXINE (SYNTHROID, LEVOTHROID) 112 MCG TABLET    Take 112 mcg by mouth daily before breakfast.   OMEPRAZOLE (PRILOSEC) 40 MG CAPSULE    Take 1 capsule (40 mg total) by mouth 2 (two) times daily. 30MINS BEFORE BREAKFAST AND DINNER   PROMETHAZINE (PHENERGAN) 25 MG TABLET    Take 25 mg by mouth every 6 (six) hours as needed for nausea or vomiting.   SUMATRIPTAN (IMITREX) 100 MG TABLET    Take 100 mg by mouth every 2 (two) hours as needed for migraine. May repeat in 2 hours if headache persists or recurs.   VALSARTAN (DIOVAN) 320 MG TABLET    Take 320 mg by mouth daily.   VENLAFAXINE XR (EFFEXOR-XR) 150 MG 24 HR CAPSULE    Take 150 mg by mouth daily.  Modified Medications   No medications on file  Discontinued Medications   No medications on file

## 2016-03-06 DIAGNOSIS — R05 Cough: Secondary | ICD-10-CM | POA: Diagnosis not present

## 2016-03-06 DIAGNOSIS — M1712 Unilateral primary osteoarthritis, left knee: Secondary | ICD-10-CM | POA: Diagnosis not present

## 2016-03-06 DIAGNOSIS — K219 Gastro-esophageal reflux disease without esophagitis: Secondary | ICD-10-CM | POA: Diagnosis not present

## 2016-03-06 DIAGNOSIS — M1711 Unilateral primary osteoarthritis, right knee: Secondary | ICD-10-CM | POA: Diagnosis not present

## 2016-03-13 DIAGNOSIS — E119 Type 2 diabetes mellitus without complications: Secondary | ICD-10-CM | POA: Diagnosis not present

## 2016-04-04 DIAGNOSIS — I10 Essential (primary) hypertension: Secondary | ICD-10-CM | POA: Diagnosis not present

## 2016-04-04 DIAGNOSIS — Z9049 Acquired absence of other specified parts of digestive tract: Secondary | ICD-10-CM | POA: Diagnosis not present

## 2016-04-04 DIAGNOSIS — J45909 Unspecified asthma, uncomplicated: Secondary | ICD-10-CM | POA: Diagnosis not present

## 2016-04-04 DIAGNOSIS — K5730 Diverticulosis of large intestine without perforation or abscess without bleeding: Secondary | ICD-10-CM | POA: Diagnosis not present

## 2016-04-04 DIAGNOSIS — E119 Type 2 diabetes mellitus without complications: Secondary | ICD-10-CM | POA: Diagnosis not present

## 2016-04-04 DIAGNOSIS — K2970 Gastritis, unspecified, without bleeding: Secondary | ICD-10-CM | POA: Diagnosis not present

## 2016-04-04 DIAGNOSIS — F329 Major depressive disorder, single episode, unspecified: Secondary | ICD-10-CM | POA: Diagnosis not present

## 2016-04-04 DIAGNOSIS — K6289 Other specified diseases of anus and rectum: Secondary | ICD-10-CM | POA: Diagnosis not present

## 2016-04-04 DIAGNOSIS — Z79899 Other long term (current) drug therapy: Secondary | ICD-10-CM | POA: Diagnosis not present

## 2016-04-04 DIAGNOSIS — Z1211 Encounter for screening for malignant neoplasm of colon: Secondary | ICD-10-CM | POA: Diagnosis not present

## 2016-04-04 DIAGNOSIS — K2270 Barrett's esophagus without dysplasia: Secondary | ICD-10-CM | POA: Diagnosis not present

## 2016-04-04 DIAGNOSIS — K219 Gastro-esophageal reflux disease without esophagitis: Secondary | ICD-10-CM | POA: Diagnosis not present

## 2016-04-04 DIAGNOSIS — K3189 Other diseases of stomach and duodenum: Secondary | ICD-10-CM | POA: Diagnosis not present

## 2016-04-04 DIAGNOSIS — K648 Other hemorrhoids: Secondary | ICD-10-CM | POA: Diagnosis not present

## 2016-04-04 DIAGNOSIS — R1310 Dysphagia, unspecified: Secondary | ICD-10-CM | POA: Diagnosis not present

## 2016-04-25 DIAGNOSIS — D510 Vitamin B12 deficiency anemia due to intrinsic factor deficiency: Secondary | ICD-10-CM | POA: Diagnosis not present

## 2016-05-15 DIAGNOSIS — K219 Gastro-esophageal reflux disease without esophagitis: Secondary | ICD-10-CM | POA: Diagnosis not present

## 2016-05-15 DIAGNOSIS — R05 Cough: Secondary | ICD-10-CM | POA: Diagnosis not present

## 2016-05-21 DIAGNOSIS — K219 Gastro-esophageal reflux disease without esophagitis: Secondary | ICD-10-CM | POA: Diagnosis not present

## 2016-05-21 DIAGNOSIS — R1310 Dysphagia, unspecified: Secondary | ICD-10-CM | POA: Diagnosis not present

## 2016-06-04 ENCOUNTER — Ambulatory Visit: Payer: Medicare Other | Admitting: Pulmonary Disease

## 2016-06-04 DIAGNOSIS — R05 Cough: Secondary | ICD-10-CM | POA: Diagnosis not present

## 2016-06-04 DIAGNOSIS — R1310 Dysphagia, unspecified: Secondary | ICD-10-CM | POA: Diagnosis not present

## 2016-06-06 DIAGNOSIS — R5381 Other malaise: Secondary | ICD-10-CM | POA: Diagnosis not present

## 2016-06-16 ENCOUNTER — Other Ambulatory Visit: Payer: Self-pay

## 2016-06-18 DIAGNOSIS — R05 Cough: Secondary | ICD-10-CM | POA: Diagnosis not present

## 2016-06-18 DIAGNOSIS — K219 Gastro-esophageal reflux disease without esophagitis: Secondary | ICD-10-CM | POA: Diagnosis not present

## 2016-06-20 DIAGNOSIS — E039 Hypothyroidism, unspecified: Secondary | ICD-10-CM | POA: Diagnosis not present

## 2016-06-20 DIAGNOSIS — I10 Essential (primary) hypertension: Secondary | ICD-10-CM | POA: Diagnosis not present

## 2016-06-20 DIAGNOSIS — D485 Neoplasm of uncertain behavior of skin: Secondary | ICD-10-CM | POA: Diagnosis not present

## 2016-06-20 DIAGNOSIS — D0471 Carcinoma in situ of skin of right lower limb, including hip: Secondary | ICD-10-CM | POA: Diagnosis not present

## 2016-06-20 DIAGNOSIS — E119 Type 2 diabetes mellitus without complications: Secondary | ICD-10-CM | POA: Diagnosis not present

## 2016-06-30 DIAGNOSIS — D510 Vitamin B12 deficiency anemia due to intrinsic factor deficiency: Secondary | ICD-10-CM | POA: Diagnosis not present

## 2016-07-03 DIAGNOSIS — K228 Other specified diseases of esophagus: Secondary | ICD-10-CM | POA: Diagnosis not present

## 2016-07-03 DIAGNOSIS — K219 Gastro-esophageal reflux disease without esophagitis: Secondary | ICD-10-CM | POA: Diagnosis not present

## 2016-07-03 DIAGNOSIS — J45909 Unspecified asthma, uncomplicated: Secondary | ICD-10-CM | POA: Diagnosis not present

## 2016-07-03 DIAGNOSIS — K449 Diaphragmatic hernia without obstruction or gangrene: Secondary | ICD-10-CM | POA: Diagnosis not present

## 2016-07-03 DIAGNOSIS — K222 Esophageal obstruction: Secondary | ICD-10-CM | POA: Diagnosis not present

## 2016-07-03 DIAGNOSIS — Z79899 Other long term (current) drug therapy: Secondary | ICD-10-CM | POA: Diagnosis not present

## 2016-07-03 DIAGNOSIS — K2950 Unspecified chronic gastritis without bleeding: Secondary | ICD-10-CM | POA: Diagnosis not present

## 2016-07-03 DIAGNOSIS — R1310 Dysphagia, unspecified: Secondary | ICD-10-CM | POA: Diagnosis not present

## 2016-07-03 DIAGNOSIS — K2270 Barrett's esophagus without dysplasia: Secondary | ICD-10-CM | POA: Diagnosis not present

## 2016-07-03 DIAGNOSIS — K209 Esophagitis, unspecified: Secondary | ICD-10-CM | POA: Diagnosis not present

## 2016-07-03 DIAGNOSIS — Z9049 Acquired absence of other specified parts of digestive tract: Secondary | ICD-10-CM | POA: Diagnosis not present

## 2016-07-03 DIAGNOSIS — R05 Cough: Secondary | ICD-10-CM | POA: Diagnosis not present

## 2016-07-03 DIAGNOSIS — E039 Hypothyroidism, unspecified: Secondary | ICD-10-CM | POA: Diagnosis not present

## 2016-07-03 DIAGNOSIS — E119 Type 2 diabetes mellitus without complications: Secondary | ICD-10-CM | POA: Diagnosis not present

## 2016-07-03 DIAGNOSIS — I10 Essential (primary) hypertension: Secondary | ICD-10-CM | POA: Diagnosis not present

## 2016-07-09 DIAGNOSIS — Z1231 Encounter for screening mammogram for malignant neoplasm of breast: Secondary | ICD-10-CM | POA: Diagnosis not present

## 2016-07-16 DIAGNOSIS — D0471 Carcinoma in situ of skin of right lower limb, including hip: Secondary | ICD-10-CM | POA: Diagnosis not present

## 2016-07-16 DIAGNOSIS — L578 Other skin changes due to chronic exposure to nonionizing radiation: Secondary | ICD-10-CM | POA: Diagnosis not present

## 2016-07-16 DIAGNOSIS — C44722 Squamous cell carcinoma of skin of right lower limb, including hip: Secondary | ICD-10-CM | POA: Diagnosis not present

## 2016-07-16 DIAGNOSIS — L821 Other seborrheic keratosis: Secondary | ICD-10-CM | POA: Diagnosis not present

## 2016-07-21 DIAGNOSIS — E119 Type 2 diabetes mellitus without complications: Secondary | ICD-10-CM | POA: Diagnosis not present

## 2016-07-21 DIAGNOSIS — R112 Nausea with vomiting, unspecified: Secondary | ICD-10-CM | POA: Diagnosis not present

## 2016-07-21 DIAGNOSIS — R509 Fever, unspecified: Secondary | ICD-10-CM | POA: Diagnosis not present

## 2016-07-23 DIAGNOSIS — M25561 Pain in right knee: Secondary | ICD-10-CM | POA: Diagnosis not present

## 2016-07-23 DIAGNOSIS — Y92009 Unspecified place in unspecified non-institutional (private) residence as the place of occurrence of the external cause: Secondary | ICD-10-CM | POA: Diagnosis not present

## 2016-07-23 DIAGNOSIS — W19XXXA Unspecified fall, initial encounter: Secondary | ICD-10-CM | POA: Diagnosis not present

## 2016-07-23 DIAGNOSIS — R55 Syncope and collapse: Secondary | ICD-10-CM | POA: Diagnosis not present

## 2016-07-30 ENCOUNTER — Ambulatory Visit (INDEPENDENT_AMBULATORY_CARE_PROVIDER_SITE_OTHER): Payer: Medicare Other | Admitting: Allergy and Immunology

## 2016-07-30 ENCOUNTER — Encounter: Payer: Self-pay | Admitting: Allergy and Immunology

## 2016-07-30 VITALS — BP 122/84 | HR 84 | Temp 98.2°F | Resp 20 | Ht 63.39 in | Wt 208.0 lb

## 2016-07-30 DIAGNOSIS — K219 Gastro-esophageal reflux disease without esophagitis: Secondary | ICD-10-CM

## 2016-07-30 NOTE — Patient Instructions (Addendum)
  1. Allergen avoidance measures  2. Treat and prevent reflux:   A. continue omeprazole 40 mg twice a day  B. start OTC ranitidine 150 mg two tablets in evening  C. slowly taper off all caffeine  3. Replace all throat clearing with a swallowing maneuver.  4. Return to clinic in 4 weeks or earlier if problem

## 2016-07-30 NOTE — Progress Notes (Signed)
Dear Dr. Cathi Garner,  Thank you for referring Christina Garner to the [facility] on 07/30/2016.   Below is a summation of this patient's evaluation and recommendations.  Thank you for your referral. I will keep you informed about this patient's response to treatment.   If you have any questions please to do hesitate to contact me.   Sincerely,  Christina Prows, MD [place]   ______________________________________________________________________    NEW PATIENT NOTE  Referring Provider: Cyndy Freeze, MD Primary Provider: Cyndy Freeze, MD Date of office visit: 07/30/2016    Subjective:   Chief Complaint:  Christina Garner (DOB: 1951/09/27) is a 65 y.o. female who presents to the clinic on 07/30/2016 with a chief complaint of Cough .     HPI: Christina Garner presents to this clinic in evaluation of cough. She's been having a unrelenting cough since December 2016 initially presenting with a febrile illness. She has seen multiple physicians and has had multiple studies performed in investigation of this cough. She has seen a pulmonologist who felt she did not have asthma and an ENT physician who felt that she had a rhinoscopy exam consistent with reflux-induced respiratory disease and a gastroenterologist and has been treated with a large collection of medical therapy including extensive antibiotic coverage, systemic steroids, inhalers, and therapy for reflux.  Presently she has cough that is about 75% better from his peak. However, she still has two bad coughing spells per day where she usually vomits. She has constant throat clearing and a "tightness in her throat" and she can no longer sing at church.  She has seen Dr. Lyda Garner in May 2017 who documented Barrett's esophagus and apparently also obtained a barium swallow which may have been abnormal. 3 weeks ago she was evaluated once again with a upper  endoscopy for her Barrett's after using a proton pump inhibitor twice a day and she also underwent esophageal dilation at that point in time.  She drinks 2 or 3 teas per day and does not consume any chocolate or alcohol nor does she smoke.  She does have a history of migraine headaches usually occurring about 3 times per month for which she will use Imitrex.  Past Medical History:  Diagnosis Date  . Acid reflux   . Anxiety   . Asthma   . Barrett's esophagus   . Diabetes ([place])   . Headache   . High blood pressure   . Hypothyroidism     Past Surgical History:  Procedure Laterality Date  . CHOLECYSTECTOMY  1998  . TONSILLECTOMY        Medication List      ALPRAZolam 1 MG tablet Commonly known as:  XANAX Take 1 mg by mouth at bedtime as needed.   budesonide-formoterol 160-4.5 MCG/ACT inhaler Commonly known as:  SYMBICORT Inhale 2 puffs into the lungs 2 (two) times daily.   cyanocobalamin 1000 MCG/ML injection Commonly known as:  (VITAMIN B-12) Inject 1,000 mcg into the muscle every 30 (thirty) days.   furosemide 40 MG tablet Commonly known as:  LASIX Take 40 mg by mouth daily as needed.   levothyroxine 112 MCG tablet Commonly known as:  SYNTHROID, LEVOTHROID Take 112 mcg by mouth daily before breakfast.   omeprazole 40 MG capsule Commonly known as:  PRILOSEC Take 1 capsule (40 mg total) by mouth 2 (two) times daily. 30MINS BEFORE BREAKFAST AND DINNER   SUMAtriptan 100  MG tablet Commonly known as:  IMITREX Take 100 mg by mouth every 2 (two) hours as needed for migraine. May repeat in 2 hours if headache persists or recurs.   valsartan 320 MG tablet Commonly known as:  DIOVAN Take 320 mg by mouth daily.   venlafaxine XR 150 MG 24 hr capsule Commonly known as:  EFFEXOR-XR Take 150 mg by mouth daily.       Allergies  Allergen Reactions  . Allopurinol Other (See Comments)    Kidney Failure  . Gabapentin Nausea And Vomiting  . Metformin And Related Other  (See Comments)    Kidney Problems.  . Penicillins     rash    Review of systems negative except as noted in HPI / PMHx or noted below:  Review of Systems  Constitutional: Negative.   HENT: Negative.   Eyes: Negative.   Respiratory: Negative.   Cardiovascular: Negative.   Gastrointestinal: Negative.   Genitourinary: Negative.   Musculoskeletal: Negative.   Skin: Negative.   Neurological: Negative.   Endo/Heme/Allergies: Negative.   Psychiatric/Behavioral: Negative.     Family History  Problem Relation Age of Onset  . Asthma Mother   . Diabetes Mother   . Atrial fibrillation Mother   . Heart Problems Mother   . Lung cancer Father   . Rheum arthritis Father   . Parkinson's disease Father   . Heart Problems Father   . High Cholesterol Sister   . Thyroid cancer Sister   . Heart attack Brother   . High Cholesterol Brother   . Migraines Brother   . High Cholesterol Sister   . Migraines Sister     Social History   Social History  . Marital status: Divorced    Spouse name: N/A  . Number of children: N/A  . Years of education: N/A   Occupational History  . Not on file.   Social History Main Topics  . Smoking status: Former Smoker    Packs/day: 0.50    Years: 3.00    Types: Cigarettes    Start date: 10/21/1967    Quit date: 10/20/1969  . Smokeless tobacco: Never Used  . Alcohol use Not on file  . Drug use: Unknown  . Sexual activity: Not on file   Other Topics Concern  . Not on file   Social History Narrative  . No narrative on file    Environmental and Social history  Lives in a house with a dry environment, a cat and dog located inside the household, no carpeting in the bedroom, plastic on the bed but not pillows, and no smokers located inside the household. 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  Throat clearing, raspy voice, slight cough  HENT:  Head: Normocephalic. Head is without right periorbital erythema and without left periorbital erythema.  Right Ear: Tympanic membrane, external ear and ear canal normal.  Left Ear: Tympanic membrane, external ear and ear canal normal.  Nose: Nose normal. No mucosal edema or rhinorrhea.  Mouth/Throat: Oropharynx is clear and moist and mucous membranes are normal. No oropharyngeal exudate.  Eyes: Conjunctivae and lids are normal. Pupils are equal, round, and reactive to light.  Neck: Trachea normal. No tracheal deviation present.  No thyromegaly present.  Cardiovascular: Normal rate, regular rhythm, S1 normal, S2 normal and normal heart sounds.   No murmur heard. Pulmonary/Chest: Effort normal. No stridor. No tachypnea. No respiratory distress. She has no wheezes. She has no rales. She exhibits no tenderness.  Abdominal: Soft. She exhibits no distension and no mass. There is no hepatosplenomegaly. There is no tenderness. There is no rebound and no guarding.  Musculoskeletal: She exhibits no edema or tenderness.  Lymphadenopathy:       Head (right side): No tonsillar adenopathy present.       Head (left side): No tonsillar adenopathy present.    She has no cervical adenopathy.    She has no axillary adenopathy.  Neurological: She is alert. Gait normal.  Resting and intention tremor  Skin: No rash noted. She is not diaphoretic. No erythema. No pallor. Nails show no clubbing.  Psychiatric: Mood and affect normal.    Diagnostics: Allergy skin tests were performed. She demonstrated hypersensitivity to house dust mite, cat, dog, and mold  Results of a barium swallow performed on 05/21/2016 identified mild esophageal dysmotility and demonstration of a predominant cricopharyngeus muscle posteriorly  Results of a chest x-ray dated 10/15/2015 identified no  significant abnormality  Results of an upper endoscopy performed on 07/03/2016 identified prominent folds at the GE junction, proximal esophageal polyp, hiatal hernia, and irregular GE junction consistent with Barrett's esophagus and documentation of empiric esophageal dilation.  Results of a sinus CT scan dated 01/01/2016 identified bilateral inhaler cell and slight left maxillary sinus mucosal thickening   Assessment and Plan:    1. LPRD (laryngopharyngeal reflux disease)     1. Allergen avoidance measures  2. Treat and prevent reflux:   A. continue omeprazole 40 mg twice a day  B. start OTC ranitidine 150 mg two tablets in evening  C. slowly taper off all caffeine  3. Replace all throat clearing with a swallowing maneuver.  4. Return to clinic in 4 weeks or earlier if problem  At this point I'm going to have a Jermisha use aggressive therapy directed against reflux as noted above which would include tapering off all of her caffeine which is going to be a very difficult endeavor for her to perform. She'll use a combination of a proton pump inhibitor twice a day and a H2 receptor blocker in the evening. I've asked her replace all of her throat clearing with a swallowing maneuver as her throat clearing maneuver is probably resulting in some inflammation of her laryngeal structures. I'll see her back in this clinic in 4 weeks and make a decision about further evaluation and treatment pending her response.  Christina Prows, MD [facility]

## 2016-07-31 DIAGNOSIS — Z23 Encounter for immunization: Secondary | ICD-10-CM | POA: Diagnosis not present

## 2016-08-01 ENCOUNTER — Encounter: Payer: Self-pay | Admitting: Allergy and Immunology

## 2016-08-06 DIAGNOSIS — L570 Actinic keratosis: Secondary | ICD-10-CM | POA: Diagnosis not present

## 2016-08-06 DIAGNOSIS — C44729 Squamous cell carcinoma of skin of left lower limb, including hip: Secondary | ICD-10-CM | POA: Diagnosis not present

## 2016-08-06 DIAGNOSIS — D0472 Carcinoma in situ of skin of left lower limb, including hip: Secondary | ICD-10-CM | POA: Diagnosis not present

## 2016-08-06 DIAGNOSIS — D485 Neoplasm of uncertain behavior of skin: Secondary | ICD-10-CM | POA: Diagnosis not present

## 2016-08-06 DIAGNOSIS — C44722 Squamous cell carcinoma of skin of right lower limb, including hip: Secondary | ICD-10-CM | POA: Diagnosis not present

## 2016-08-21 DIAGNOSIS — D3612 Benign neoplasm of peripheral nerves and autonomic nervous system, upper limb, including shoulder: Secondary | ICD-10-CM | POA: Diagnosis not present

## 2016-08-21 DIAGNOSIS — L820 Inflamed seborrheic keratosis: Secondary | ICD-10-CM | POA: Diagnosis not present

## 2016-08-21 DIAGNOSIS — D485 Neoplasm of uncertain behavior of skin: Secondary | ICD-10-CM | POA: Diagnosis not present

## 2016-08-27 ENCOUNTER — Ambulatory Visit: Payer: Medicare Other | Admitting: Allergy and Immunology

## 2016-09-01 DIAGNOSIS — G8929 Other chronic pain: Secondary | ICD-10-CM | POA: Diagnosis not present

## 2016-09-01 DIAGNOSIS — M1712 Unilateral primary osteoarthritis, left knee: Secondary | ICD-10-CM | POA: Diagnosis not present

## 2016-09-01 DIAGNOSIS — M170 Bilateral primary osteoarthritis of knee: Secondary | ICD-10-CM | POA: Diagnosis not present

## 2016-09-29 DIAGNOSIS — S96912A Strain of unspecified muscle and tendon at ankle and foot level, left foot, initial encounter: Secondary | ICD-10-CM | POA: Diagnosis not present

## 2016-09-29 DIAGNOSIS — S86912A Strain of unspecified muscle(s) and tendon(s) at lower leg level, left leg, initial encounter: Secondary | ICD-10-CM | POA: Diagnosis not present

## 2016-10-30 DIAGNOSIS — H6505 Acute serous otitis media, recurrent, left ear: Secondary | ICD-10-CM | POA: Diagnosis not present

## 2016-11-28 DIAGNOSIS — M150 Primary generalized (osteo)arthritis: Secondary | ICD-10-CM | POA: Diagnosis not present

## 2016-12-08 DIAGNOSIS — M170 Bilateral primary osteoarthritis of knee: Secondary | ICD-10-CM | POA: Diagnosis not present

## 2016-12-19 DIAGNOSIS — E119 Type 2 diabetes mellitus without complications: Secondary | ICD-10-CM | POA: Diagnosis not present

## 2017-01-09 DIAGNOSIS — W5501XA Bitten by cat, initial encounter: Secondary | ICD-10-CM | POA: Diagnosis not present

## 2017-01-09 DIAGNOSIS — S61031A Puncture wound without foreign body of right thumb without damage to nail, initial encounter: Secondary | ICD-10-CM | POA: Diagnosis not present

## 2017-02-04 DIAGNOSIS — L570 Actinic keratosis: Secondary | ICD-10-CM | POA: Diagnosis not present

## 2017-02-04 DIAGNOSIS — D1801 Hemangioma of skin and subcutaneous tissue: Secondary | ICD-10-CM | POA: Diagnosis not present

## 2017-02-04 DIAGNOSIS — L821 Other seborrheic keratosis: Secondary | ICD-10-CM | POA: Diagnosis not present

## 2017-02-04 DIAGNOSIS — D485 Neoplasm of uncertain behavior of skin: Secondary | ICD-10-CM | POA: Diagnosis not present

## 2017-02-04 DIAGNOSIS — E119 Type 2 diabetes mellitus without complications: Secondary | ICD-10-CM | POA: Diagnosis not present

## 2017-02-04 DIAGNOSIS — C44729 Squamous cell carcinoma of skin of left lower limb, including hip: Secondary | ICD-10-CM | POA: Diagnosis not present

## 2017-02-04 DIAGNOSIS — C44521 Squamous cell carcinoma of skin of breast: Secondary | ICD-10-CM | POA: Diagnosis not present

## 2017-02-04 DIAGNOSIS — D235 Other benign neoplasm of skin of trunk: Secondary | ICD-10-CM | POA: Diagnosis not present

## 2017-02-04 DIAGNOSIS — L309 Dermatitis, unspecified: Secondary | ICD-10-CM | POA: Diagnosis not present

## 2017-02-04 DIAGNOSIS — L814 Other melanin hyperpigmentation: Secondary | ICD-10-CM | POA: Diagnosis not present

## 2017-02-04 DIAGNOSIS — B078 Other viral warts: Secondary | ICD-10-CM | POA: Diagnosis not present

## 2017-02-04 DIAGNOSIS — B353 Tinea pedis: Secondary | ICD-10-CM | POA: Diagnosis not present

## 2017-02-18 DIAGNOSIS — C44521 Squamous cell carcinoma of skin of breast: Secondary | ICD-10-CM | POA: Diagnosis not present

## 2017-02-18 DIAGNOSIS — L821 Other seborrheic keratosis: Secondary | ICD-10-CM | POA: Diagnosis not present

## 2017-02-18 DIAGNOSIS — L570 Actinic keratosis: Secondary | ICD-10-CM | POA: Diagnosis not present

## 2017-03-30 DIAGNOSIS — D510 Vitamin B12 deficiency anemia due to intrinsic factor deficiency: Secondary | ICD-10-CM | POA: Diagnosis not present

## 2017-04-02 DIAGNOSIS — R05 Cough: Secondary | ICD-10-CM | POA: Diagnosis not present

## 2017-04-02 DIAGNOSIS — K219 Gastro-esophageal reflux disease without esophagitis: Secondary | ICD-10-CM | POA: Diagnosis not present

## 2017-04-02 DIAGNOSIS — R1310 Dysphagia, unspecified: Secondary | ICD-10-CM | POA: Diagnosis not present

## 2017-04-07 DIAGNOSIS — N289 Disorder of kidney and ureter, unspecified: Secondary | ICD-10-CM | POA: Diagnosis not present

## 2017-04-07 DIAGNOSIS — Z79899 Other long term (current) drug therapy: Secondary | ICD-10-CM | POA: Diagnosis not present

## 2017-04-07 DIAGNOSIS — K219 Gastro-esophageal reflux disease without esophagitis: Secondary | ICD-10-CM | POA: Diagnosis not present

## 2017-04-07 DIAGNOSIS — K208 Other esophagitis: Secondary | ICD-10-CM | POA: Diagnosis not present

## 2017-04-07 DIAGNOSIS — E1122 Type 2 diabetes mellitus with diabetic chronic kidney disease: Secondary | ICD-10-CM | POA: Diagnosis not present

## 2017-04-07 DIAGNOSIS — K449 Diaphragmatic hernia without obstruction or gangrene: Secondary | ICD-10-CM | POA: Diagnosis not present

## 2017-04-07 DIAGNOSIS — I10 Essential (primary) hypertension: Secondary | ICD-10-CM | POA: Diagnosis not present

## 2017-04-07 DIAGNOSIS — R05 Cough: Secondary | ICD-10-CM | POA: Diagnosis not present

## 2017-04-07 DIAGNOSIS — R1310 Dysphagia, unspecified: Secondary | ICD-10-CM | POA: Diagnosis not present

## 2017-04-07 DIAGNOSIS — K2270 Barrett's esophagus without dysplasia: Secondary | ICD-10-CM | POA: Diagnosis not present

## 2017-04-07 DIAGNOSIS — Z9049 Acquired absence of other specified parts of digestive tract: Secondary | ICD-10-CM | POA: Diagnosis not present

## 2017-04-07 DIAGNOSIS — J45909 Unspecified asthma, uncomplicated: Secondary | ICD-10-CM | POA: Diagnosis not present

## 2017-04-08 DIAGNOSIS — G44011 Episodic cluster headache, intractable: Secondary | ICD-10-CM | POA: Diagnosis not present

## 2017-04-10 DIAGNOSIS — R51 Headache: Secondary | ICD-10-CM | POA: Diagnosis not present

## 2017-04-10 DIAGNOSIS — G44011 Episodic cluster headache, intractable: Secondary | ICD-10-CM | POA: Diagnosis not present

## 2017-04-29 DIAGNOSIS — H6983 Other specified disorders of Eustachian tube, bilateral: Secondary | ICD-10-CM | POA: Diagnosis not present

## 2017-04-29 DIAGNOSIS — Z6824 Body mass index (BMI) 24.0-24.9, adult: Secondary | ICD-10-CM | POA: Diagnosis not present

## 2017-05-08 DIAGNOSIS — H6120 Impacted cerumen, unspecified ear: Secondary | ICD-10-CM | POA: Diagnosis not present

## 2017-05-11 DIAGNOSIS — H9041 Sensorineural hearing loss, unilateral, right ear, with unrestricted hearing on the contralateral side: Secondary | ICD-10-CM | POA: Diagnosis not present

## 2017-05-11 DIAGNOSIS — T162XXA Foreign body in left ear, initial encounter: Secondary | ICD-10-CM | POA: Diagnosis not present

## 2017-05-11 DIAGNOSIS — R51 Headache: Secondary | ICD-10-CM | POA: Diagnosis not present

## 2017-05-13 DIAGNOSIS — R1310 Dysphagia, unspecified: Secondary | ICD-10-CM | POA: Diagnosis not present

## 2017-05-13 DIAGNOSIS — K219 Gastro-esophageal reflux disease without esophagitis: Secondary | ICD-10-CM | POA: Diagnosis not present

## 2017-05-18 DIAGNOSIS — M170 Bilateral primary osteoarthritis of knee: Secondary | ICD-10-CM | POA: Diagnosis not present

## 2017-05-29 DIAGNOSIS — R51 Headache: Secondary | ICD-10-CM | POA: Diagnosis not present

## 2017-07-16 DIAGNOSIS — Z1389 Encounter for screening for other disorder: Secondary | ICD-10-CM | POA: Diagnosis not present

## 2017-07-16 DIAGNOSIS — G250 Essential tremor: Secondary | ICD-10-CM | POA: Diagnosis not present

## 2017-07-16 DIAGNOSIS — E119 Type 2 diabetes mellitus without complications: Secondary | ICD-10-CM | POA: Diagnosis not present

## 2017-07-16 DIAGNOSIS — Z9181 History of falling: Secondary | ICD-10-CM | POA: Diagnosis not present

## 2017-07-17 DIAGNOSIS — D510 Vitamin B12 deficiency anemia due to intrinsic factor deficiency: Secondary | ICD-10-CM | POA: Diagnosis not present

## 2017-07-17 DIAGNOSIS — Z23 Encounter for immunization: Secondary | ICD-10-CM | POA: Diagnosis not present

## 2017-07-27 DIAGNOSIS — E119 Type 2 diabetes mellitus without complications: Secondary | ICD-10-CM | POA: Diagnosis not present

## 2017-07-27 DIAGNOSIS — I10 Essential (primary) hypertension: Secondary | ICD-10-CM | POA: Diagnosis not present

## 2017-07-27 DIAGNOSIS — D510 Vitamin B12 deficiency anemia due to intrinsic factor deficiency: Secondary | ICD-10-CM | POA: Diagnosis not present

## 2017-07-27 DIAGNOSIS — G250 Essential tremor: Secondary | ICD-10-CM | POA: Diagnosis not present

## 2017-07-27 DIAGNOSIS — E559 Vitamin D deficiency, unspecified: Secondary | ICD-10-CM | POA: Diagnosis not present

## 2017-07-30 DIAGNOSIS — R509 Fever, unspecified: Secondary | ICD-10-CM | POA: Diagnosis not present

## 2017-07-30 DIAGNOSIS — E559 Vitamin D deficiency, unspecified: Secondary | ICD-10-CM | POA: Diagnosis not present

## 2017-07-30 DIAGNOSIS — E611 Iron deficiency: Secondary | ICD-10-CM | POA: Diagnosis not present

## 2017-07-30 DIAGNOSIS — J208 Acute bronchitis due to other specified organisms: Secondary | ICD-10-CM | POA: Diagnosis not present

## 2017-09-08 DIAGNOSIS — N183 Chronic kidney disease, stage 3 (moderate): Secondary | ICD-10-CM | POA: Diagnosis not present

## 2017-09-08 DIAGNOSIS — E559 Vitamin D deficiency, unspecified: Secondary | ICD-10-CM | POA: Diagnosis not present

## 2017-09-08 DIAGNOSIS — E039 Hypothyroidism, unspecified: Secondary | ICD-10-CM | POA: Diagnosis not present

## 2017-09-08 DIAGNOSIS — K219 Gastro-esophageal reflux disease without esophagitis: Secondary | ICD-10-CM | POA: Diagnosis not present

## 2017-09-08 DIAGNOSIS — D485 Neoplasm of uncertain behavior of skin: Secondary | ICD-10-CM | POA: Diagnosis not present

## 2017-09-08 DIAGNOSIS — C44722 Squamous cell carcinoma of skin of right lower limb, including hip: Secondary | ICD-10-CM | POA: Diagnosis not present

## 2017-10-07 DIAGNOSIS — L905 Scar conditions and fibrosis of skin: Secondary | ICD-10-CM | POA: Diagnosis not present

## 2017-10-07 DIAGNOSIS — L570 Actinic keratosis: Secondary | ICD-10-CM | POA: Diagnosis not present

## 2017-10-07 DIAGNOSIS — Z85828 Personal history of other malignant neoplasm of skin: Secondary | ICD-10-CM | POA: Diagnosis not present

## 2017-10-07 DIAGNOSIS — C44722 Squamous cell carcinoma of skin of right lower limb, including hip: Secondary | ICD-10-CM | POA: Diagnosis not present

## 2017-10-23 DIAGNOSIS — E039 Hypothyroidism, unspecified: Secondary | ICD-10-CM | POA: Diagnosis not present

## 2017-10-23 DIAGNOSIS — K219 Gastro-esophageal reflux disease without esophagitis: Secondary | ICD-10-CM | POA: Diagnosis not present

## 2017-10-23 DIAGNOSIS — Z79899 Other long term (current) drug therapy: Secondary | ICD-10-CM | POA: Diagnosis not present

## 2017-10-23 DIAGNOSIS — E559 Vitamin D deficiency, unspecified: Secondary | ICD-10-CM | POA: Diagnosis not present

## 2017-10-28 DIAGNOSIS — Z1231 Encounter for screening mammogram for malignant neoplasm of breast: Secondary | ICD-10-CM | POA: Diagnosis not present

## 2017-10-30 DIAGNOSIS — N3000 Acute cystitis without hematuria: Secondary | ICD-10-CM | POA: Diagnosis not present

## 2017-11-02 DIAGNOSIS — D510 Vitamin B12 deficiency anemia due to intrinsic factor deficiency: Secondary | ICD-10-CM | POA: Diagnosis not present

## 2017-12-09 DIAGNOSIS — D485 Neoplasm of uncertain behavior of skin: Secondary | ICD-10-CM | POA: Diagnosis not present

## 2017-12-09 DIAGNOSIS — L570 Actinic keratosis: Secondary | ICD-10-CM | POA: Diagnosis not present

## 2017-12-09 DIAGNOSIS — Z85828 Personal history of other malignant neoplasm of skin: Secondary | ICD-10-CM | POA: Diagnosis not present

## 2017-12-09 DIAGNOSIS — D225 Melanocytic nevi of trunk: Secondary | ICD-10-CM | POA: Diagnosis not present

## 2017-12-09 DIAGNOSIS — D0471 Carcinoma in situ of skin of right lower limb, including hip: Secondary | ICD-10-CM | POA: Diagnosis not present

## 2017-12-09 DIAGNOSIS — L820 Inflamed seborrheic keratosis: Secondary | ICD-10-CM | POA: Diagnosis not present

## 2017-12-09 DIAGNOSIS — C44722 Squamous cell carcinoma of skin of right lower limb, including hip: Secondary | ICD-10-CM | POA: Diagnosis not present

## 2018-01-15 DIAGNOSIS — J189 Pneumonia, unspecified organism: Secondary | ICD-10-CM | POA: Diagnosis not present

## 2018-01-25 DIAGNOSIS — G8929 Other chronic pain: Secondary | ICD-10-CM | POA: Diagnosis not present

## 2018-01-25 DIAGNOSIS — M1711 Unilateral primary osteoarthritis, right knee: Secondary | ICD-10-CM | POA: Diagnosis not present

## 2018-01-25 DIAGNOSIS — M7552 Bursitis of left shoulder: Secondary | ICD-10-CM | POA: Diagnosis not present

## 2018-02-01 DIAGNOSIS — D510 Vitamin B12 deficiency anemia due to intrinsic factor deficiency: Secondary | ICD-10-CM | POA: Diagnosis not present

## 2018-02-10 DIAGNOSIS — H6692 Otitis media, unspecified, left ear: Secondary | ICD-10-CM | POA: Diagnosis not present

## 2018-03-17 DIAGNOSIS — D485 Neoplasm of uncertain behavior of skin: Secondary | ICD-10-CM | POA: Diagnosis not present

## 2018-03-17 DIAGNOSIS — Z85828 Personal history of other malignant neoplasm of skin: Secondary | ICD-10-CM | POA: Diagnosis not present

## 2018-03-17 DIAGNOSIS — L570 Actinic keratosis: Secondary | ICD-10-CM | POA: Diagnosis not present

## 2018-03-17 DIAGNOSIS — C44729 Squamous cell carcinoma of skin of left lower limb, including hip: Secondary | ICD-10-CM | POA: Diagnosis not present

## 2018-03-17 DIAGNOSIS — D0471 Carcinoma in situ of skin of right lower limb, including hip: Secondary | ICD-10-CM | POA: Diagnosis not present

## 2018-04-15 DIAGNOSIS — K219 Gastro-esophageal reflux disease without esophagitis: Secondary | ICD-10-CM | POA: Diagnosis not present

## 2018-04-15 DIAGNOSIS — D510 Vitamin B12 deficiency anemia due to intrinsic factor deficiency: Secondary | ICD-10-CM | POA: Diagnosis not present

## 2018-04-15 DIAGNOSIS — K591 Functional diarrhea: Secondary | ICD-10-CM | POA: Diagnosis not present

## 2018-05-26 DIAGNOSIS — D485 Neoplasm of uncertain behavior of skin: Secondary | ICD-10-CM | POA: Diagnosis not present

## 2018-05-26 DIAGNOSIS — C44722 Squamous cell carcinoma of skin of right lower limb, including hip: Secondary | ICD-10-CM | POA: Diagnosis not present

## 2018-05-26 DIAGNOSIS — C44729 Squamous cell carcinoma of skin of left lower limb, including hip: Secondary | ICD-10-CM | POA: Diagnosis not present

## 2018-06-07 DIAGNOSIS — E119 Type 2 diabetes mellitus without complications: Secondary | ICD-10-CM | POA: Diagnosis not present

## 2018-06-28 DIAGNOSIS — D485 Neoplasm of uncertain behavior of skin: Secondary | ICD-10-CM | POA: Diagnosis not present

## 2018-06-30 DIAGNOSIS — D510 Vitamin B12 deficiency anemia due to intrinsic factor deficiency: Secondary | ICD-10-CM | POA: Diagnosis not present

## 2018-08-04 DIAGNOSIS — E781 Pure hyperglyceridemia: Secondary | ICD-10-CM | POA: Diagnosis not present

## 2018-08-04 DIAGNOSIS — R072 Precordial pain: Secondary | ICD-10-CM | POA: Diagnosis not present

## 2018-08-04 DIAGNOSIS — Z79899 Other long term (current) drug therapy: Secondary | ICD-10-CM | POA: Diagnosis not present

## 2018-08-04 DIAGNOSIS — Z0000 Encounter for general adult medical examination without abnormal findings: Secondary | ICD-10-CM | POA: Diagnosis not present

## 2018-08-04 DIAGNOSIS — E1169 Type 2 diabetes mellitus with other specified complication: Secondary | ICD-10-CM | POA: Diagnosis not present

## 2018-08-04 DIAGNOSIS — M791 Myalgia, unspecified site: Secondary | ICD-10-CM | POA: Diagnosis not present

## 2018-08-04 DIAGNOSIS — E039 Hypothyroidism, unspecified: Secondary | ICD-10-CM | POA: Diagnosis not present

## 2018-08-04 DIAGNOSIS — Z23 Encounter for immunization: Secondary | ICD-10-CM | POA: Diagnosis not present

## 2018-08-04 DIAGNOSIS — E785 Hyperlipidemia, unspecified: Secondary | ICD-10-CM | POA: Diagnosis not present

## 2018-08-19 DIAGNOSIS — K760 Fatty (change of) liver, not elsewhere classified: Secondary | ICD-10-CM | POA: Diagnosis not present

## 2018-08-19 DIAGNOSIS — R1084 Generalized abdominal pain: Secondary | ICD-10-CM | POA: Diagnosis not present

## 2018-09-02 DIAGNOSIS — M170 Bilateral primary osteoarthritis of knee: Secondary | ICD-10-CM | POA: Diagnosis not present

## 2018-09-02 DIAGNOSIS — G8929 Other chronic pain: Secondary | ICD-10-CM | POA: Diagnosis not present

## 2018-09-09 ENCOUNTER — Other Ambulatory Visit: Payer: Self-pay

## 2018-09-28 ENCOUNTER — Ambulatory Visit: Payer: Medicare Other | Admitting: Neurology

## 2018-11-15 DIAGNOSIS — R51 Headache: Secondary | ICD-10-CM | POA: Diagnosis not present

## 2018-11-17 DIAGNOSIS — I10 Essential (primary) hypertension: Secondary | ICD-10-CM | POA: Diagnosis not present

## 2018-11-17 DIAGNOSIS — G250 Essential tremor: Secondary | ICD-10-CM | POA: Diagnosis not present

## 2018-11-17 DIAGNOSIS — R55 Syncope and collapse: Secondary | ICD-10-CM | POA: Diagnosis not present

## 2018-11-18 ENCOUNTER — Ambulatory Visit (INDEPENDENT_AMBULATORY_CARE_PROVIDER_SITE_OTHER): Payer: Medicare Other | Admitting: Neurology

## 2018-11-18 ENCOUNTER — Encounter: Payer: Self-pay | Admitting: Neurology

## 2018-11-18 VITALS — BP 150/94 | HR 60 | Ht 65.0 in

## 2018-11-18 DIAGNOSIS — G250 Essential tremor: Secondary | ICD-10-CM

## 2018-11-18 MED ORDER — PRIMIDONE 50 MG PO TABS: ORAL_TABLET | ORAL | 3 refills | Status: DC

## 2018-11-18 NOTE — Patient Instructions (Addendum)
You have a mild hand and head tremor, in keeping with essential tremor.   I do not see any signs or symptoms of parkinson's like disease or what we call parkinsonism.   For your tremor, I would recommend a trial of Mysoline (primidone) 50 mg strength:   Take 1/2 pill each bedtime x 1 week, then 1 pill nightly x 1 week, then 1 1/2 pills nightly x week, then 2 pills nightly thereafter.   Common side effects reported are: Sleepiness, drowsiness, balance problems, confusion, and GI related symptoms.  We will check in with you in 3 months.   Please remember, that any kind of tremor may be exacerbated by anxiety, anger, nervousness, excitement, dehydration, sleep deprivation, by caffeine, and low blood sugar values or blood sugar fluctuations. Some medications can exacerbate tremors.

## 2018-11-18 NOTE — Progress Notes (Signed)
Subjective:    Patient ID: Christina Garner is a 68 y.o. female.  HPI     Star Age, MD, PhD [facility] [address], [address] [place], [place] [address]  Dear Dr. Venetia Maxon,   I saw your patient, Christina Garner, upon your kind request in my neurologic clinic today for initial consultation of her tremors. The patient is accompanied today. As you know, Christina Garner is a 68 year old right-handed woman with an underlying medical history of asthma, anxiety, hypothyroidism, reflux disease, diabetes, hypertension, chronic kidney disease and obesity, who reports a 2-3 year history of hand tremors, left more than right, with some progression noted. She is worried about symptoms of Parkinson's disease as she has a family history of Parkinson's disease. I reviewed your office note from your office, which you kindly included. She had blood work through your office on 08/04/2018 and I reviewed the results. Free T4 was 1.05, TSH 2.154, A1c 5.9, triglycerides elevated at 3 and 9, otherwise total cholesterol 185, LDL 144, CBC with differential unremarkable, CMP showed a glucose level of 120 and AST mildly elevated at 48. She reports a family history of Parkinson's disease affecting her father, paternal grandfather, and paternal great-grandmother. For her tremor she was started on Inderal LA recently, but did not use it d/t cost. She has been on atenolol for about 2-3 years. She has seen pulmonologist and GI for a cough, was told, it is reflux. Treatment for reflux improved the cough. She has seen an allergy specialist as well. Apparently she is allergic to cats and dogs. She lives with 2 dogs and one cat. She is widowed for over 30 years, no children. She lives alone. She currently works part-time, previously full-time at a [employer]. She has been stable on Effexor for years. She recently fell, this was an accidental fall outside. She's currently being treated for sinus  infection. She does not drink alcohol, she reports a family history of alcoholism. She drinks caffeine in the form of tea. She quit smoking in 1978 and was not a long-term smoker.  Her Past Medical History Is Significant For: Past Medical History:  Diagnosis Date  . Acid reflux   . Adult hypothyroidism   . Anxiety   . Asthma   . Barrett's esophagus   . Chronic kidney disease   . Diabetes ([place])   . GAD (generalized anxiety disorder)   . GERD (gastroesophageal reflux disease)   . Headache   . High blood pressure   . Hypothyroidism   . Tremor     Her Past Surgical History Is Significant For: Past Surgical History:  Procedure Laterality Date  . CHOLECYSTECTOMY  1998  . TONSILLECTOMY      Her Family History Is Significant For: Family History  Problem Relation Age of Onset  . Asthma Mother   . Diabetes Mother   . Atrial fibrillation Mother   . Heart Problems Mother   . Lung cancer Father   . Rheum arthritis Father   . Parkinson's disease Father   . Heart Problems Father   . High Cholesterol Sister   . Thyroid cancer Sister   . Heart attack Brother   . High Cholesterol Brother   . Migraines Brother   . High Cholesterol Sister   . Migraines Sister     Her Social History Is Significant For: Social History   Socioeconomic History  . Marital status: Divorced    Spouse name: Not on file  . Number  of children: Not on file  . Years of education: Not on file  . Highest education level: Not on file  Occupational History  . Not on file  Social Needs  . Financial resource strain: Not on file  . Food insecurity:    Worry: Not on file    Inability: Not on file  . Transportation needs:    Medical: Not on file    Non-medical: Not on file  Tobacco Use  . Smoking status: Former Smoker    Packs/day: 0.50    Years: 3.00    Pack years: 1.50    Types: Cigarettes    Start date: 10/21/1967    Last attempt to quit: 10/20/1969    Years since quitting: 49.1  . Smokeless  tobacco: Never Used  Substance and Sexual Activity  . Alcohol use: Not on file  . Drug use: Not on file  . Sexual activity: Not on file  Lifestyle  . Physical activity:    Days per week: Not on file    Minutes per session: Not on file  . Stress: Not on file  Relationships  . Social connections:    Talks on phone: Not on file    Gets together: Not on file    Attends religious service: Not on file    Active member of club or organization: Not on file    Attends meetings of clubs or organizations: Not on file    Relationship status: Not on file  Other Topics Concern  . Not on file  Social History Narrative  . Not on file    Her Allergies Are:  Allergies  Allergen Reactions  . Allopurinol Other (See Comments)    Kidney Failure  . Gabapentin Nausea And Vomiting  . Metformin And Related Other (See Comments)    Kidney Problems.  . Penicillins     rash  :   Her Current Medications Are:  Outpatient Encounter Medications as of 11/18/2018  Medication Sig  . ALPRAZolam (XANAX) 1 MG tablet Take 1 mg by mouth at bedtime as needed.  Marland Kitchen atenolol (TENORMIN) 50 MG tablet Take 50 mg by mouth daily.  . cyanocobalamin (,VITAMIN B-12,) 1000 MCG/ML injection Inject 1,000 mcg into the muscle every 30 (thirty) days.  . furosemide (LASIX) 40 MG tablet Take 40 mg by mouth daily as needed.  Marland Kitchen levothyroxine (SYNTHROID, LEVOTHROID) 112 MCG tablet Take 112 mcg by mouth daily before breakfast.  . losartan (COZAAR) 100 MG tablet Take 100 mg by mouth daily.  Marland Kitchen omeprazole (PRILOSEC) 40 MG capsule Take 1 capsule (40 mg total) by mouth 2 (two) times daily. 30MINS BEFORE BREAKFAST AND DINNER  . ranitidine (ZANTAC) 300 MG capsule Take 300 mg by mouth every evening.  . SUMAtriptan (IMITREX) 100 MG tablet Take 100 mg by mouth every 2 (two) hours as needed for migraine. May repeat in 2 hours if headache persists or recurs.  . triamcinolone cream (KENALOG) 0.1 % Apply 1 application topically.  . venlafaxine XR  (EFFEXOR-XR) 150 MG 24 hr capsule Take 150 mg by mouth daily.  . [DISCONTINUED] budesonide-formoterol (SYMBICORT) 160-4.5 MCG/ACT inhaler Inhale 2 puffs into the lungs 2 (two) times daily.  . [DISCONTINUED] valsartan (DIOVAN) 320 MG tablet Take 320 mg by mouth daily.   No facility-administered encounter medications on file as of 11/18/2018.   : Review of Systems:  Out of a complete 14 point review of systems, all are reviewed and negative with the exception of these symptoms as listed below:  Review of Systems  Neurological:       Pt presents today to discuss his tremor. She is right handed but the tremors are worse in her left hand. Pt is worried that she may have parkinson's disease.    Objective:  Neurological Exam  Physical Exam Physical Examination:   Vitals:   11/18/18 1427  BP: (!) 150/94  Pulse: 60   General Examination: The patient is a very pleasant 68 y.o. female in no acute distress. She appears well-developed and well-nourished and well groomed.   HEENT: Normocephalic, atraumatic, pupils are equal, round and reactive to light and accommodation. Extraocular tracking is good without limitation to gaze excursion or nystagmus noted. Normal smooth pursuit is noted. Hearing is grossly intact. Face is symmetric with normal facial animation and normal facial sensation. Speech is clear with no dysarthria noted. There is no hypophonia. There is a mild lower lip tremor and a side-to-side head tremor.maybe slight voice tremor at times. Neck is supple with full range of passive and active motion. There are no carotid bruits on auscultation. Oropharynx exam reveals: mild mouth dryness, adequate dental hygiene dentures in place on top. Tongue protrudes centrally and palate elevates symmetrically.    Chest: Clear to auscultation without wheezing, rhonchi or crackles noted.  Heart: S1+S2+0, regular and normal without murmurs, rubs or gallops noted.   Abdomen: Soft, non-tender and  non-distended with normal bowel sounds appreciated on auscultation.  Extremities: There is no pitting edema in the distal lower extremities bilaterally.   Skin: Warm and dry without trophic changes noted.  Musculoskeletal: exam reveals bilateral knee discomfort.   Neurologically:  Mental status: The patient is awake, alert and oriented in all 4 spheres. Her immediate and remote memory, attention, language skills and fund of knowledge are appropriate. There is no evidence of aphasia, agnosia, apraxia or anomia. Speech is clear with normal prosody and enunciation. Thought process is linear. Mood is normal and affect is normal.  Cranial nerves II - XII are as described above under HEENT exam. In addition: shoulder shrug is normal with equal shoulder height noted. Motor exam: Normal bulk, strength and tone is noted. There is no drift, Romberg is negative.   On 11/18/2018: on Archimedes spiral drawing she has mild to moderate trembling with the left hand, minimal trembling with the right hand, handwriting with the right hand is legible, minimally tremulous, not micrographic.  She has bilateral upper action and postural and action tremor, slightly more pronounced on the left. She has no intention tremor. She has a very slight and intermittent resting component to the tremor in both upper extremities.  Reflexes are 1+ in the upper extremities, absent in the lower extremities. Fine motor skills and coordination: lightly impaired with finger taps and foot taps but otherwise unremarkable, no significant decrement in amplitude noted.  Cerebellar testing: No dysmetria or intention tremor. There is no truncal or gait ataxia.  Sensory exam: intact to light touch in the upper and lower extremities.  Gait, station and balance: She stands with mild difficulty due to knee discomfort. She walks without a limp but somewhat slowly and cautiously, she has preserved arm swing bilaterally.  Assessment and Plan:   In  summary, Christina Garner is a very pleasant 68 y.o.-year old female with an underlying medical history of asthma, anxiety, hypothyroidism, reflux disease, diabetes, hypertension, chronic kidney disease and obesity, who Presents for evaluation of her tremors. On examination she has a mild head tremor, slight lower lip and jaw tremor,  and bilateral upper extremity tremor, slightly more pronounced on the left, history and exam in keeping with essential tremor. She has no telltale evidence of parkinsonism and is reassured in that regard. She has been on atenolol 50 mg once daily for about 2 or 3 years. I suggested symptomatic treatment with a trial of Mysoline starting at a low dose. She is advised about the possible sedating properties of primidone and the possibility of having balance problems with it. She does live alone and is cautioned regarding her driving and taking this medication only at a low dose at night for now. She will gradually increase it from 25 mg to up to 100 mg over the next 4 weeks. We talked about expectations, side effects and limitation of her medication. She was given a new prescription and written instructions. I suggested a follow-up routinely with one of our nurse practitioners for a tremor recheck in about 3 months, sooner if needed. I answered all her questions today and she was in agreement. Thank you very much for allowing me to participate in the care of this nice patient. If I can be of any further assistance to you please do not hesitate to call me at [phone].  Sincerely,   Star Age, MD, PhD

## 2018-12-01 DIAGNOSIS — Z1231 Encounter for screening mammogram for malignant neoplasm of breast: Secondary | ICD-10-CM | POA: Diagnosis not present

## 2018-12-06 ENCOUNTER — Telehealth: Payer: Self-pay | Admitting: Neurology

## 2018-12-06 NOTE — Telephone Encounter (Signed)
There is really no other medication I would feel comfortable trying. She is already on a beta blocker. If she feels that the Mysoline at the half tablet is helpful she can continue with that, if she feels that it is not effective or causes side effects even at a low dose, she will have to stop it. Please call her back.

## 2018-12-06 NOTE — Telephone Encounter (Signed)
Pt returned my call. I discussed with her Dr. Guadelupe Sabin recommendations regarding the primidone. Pt will try to stay on the low dose of primidone. Pt verbalized understanding of recommendations. Pt had no questions at this time but was encouraged to call back if questions arise.

## 2018-12-06 NOTE — Telephone Encounter (Signed)
I called pt to discuss. No answer, left a message asking her to call me back. 

## 2018-12-06 NOTE — Telephone Encounter (Signed)
I called pt. She notes that the primidone helped her tremor but she was emotional, tired, and hungry since starting it. She made it up to 1 tablet QHS but backed down to 1/2 tablet QHS because she couldn't tolerate the side effects. Pt is wondering if there is any other medications she may try.

## 2018-12-06 NOTE — Telephone Encounter (Signed)
Pt states that she can no longer take her primidone (MYSOLINE) 50 MG tablet She states it did help but it is making her to emotional, tiered and hungry. She would like to know if there is something else that she can try. Pt states that she uses [facility] in [place]. Please advise.

## 2019-02-03 DIAGNOSIS — M791 Myalgia, unspecified site: Secondary | ICD-10-CM | POA: Diagnosis not present

## 2019-02-03 DIAGNOSIS — T466X5A Adverse effect of antihyperlipidemic and antiarteriosclerotic drugs, initial encounter: Secondary | ICD-10-CM | POA: Diagnosis not present

## 2019-02-03 DIAGNOSIS — M170 Bilateral primary osteoarthritis of knee: Secondary | ICD-10-CM | POA: Diagnosis not present

## 2019-03-01 ENCOUNTER — Telehealth: Payer: Self-pay | Admitting: *Deleted

## 2019-03-01 NOTE — Telephone Encounter (Signed)
LMVM for pt to return call to convert appt to [facility] Due to current COVID 19 pandemic, our office is severely reducing in office visits until further notice, in order to minimize the risk to our patients and healthcare providers.

## 2019-03-02 ENCOUNTER — Ambulatory Visit: Payer: Medicare Other | Admitting: Adult Health

## 2019-03-02 NOTE — Telephone Encounter (Signed)
Called home, her work, she is not working, retired now, and then mobile. LMVM for her that was cancelling appt due to not returning calls for convert appt for her to [facility]. She will need to call back and r/s.

## 2019-03-30 DIAGNOSIS — K219 Gastro-esophageal reflux disease without esophagitis: Secondary | ICD-10-CM | POA: Diagnosis not present

## 2019-03-30 DIAGNOSIS — K591 Functional diarrhea: Secondary | ICD-10-CM | POA: Diagnosis not present

## 2019-05-24 DIAGNOSIS — D649 Anemia, unspecified: Secondary | ICD-10-CM | POA: Diagnosis not present

## 2019-05-24 DIAGNOSIS — R739 Hyperglycemia, unspecified: Secondary | ICD-10-CM | POA: Diagnosis not present

## 2019-05-24 DIAGNOSIS — E559 Vitamin D deficiency, unspecified: Secondary | ICD-10-CM | POA: Diagnosis not present

## 2019-05-24 DIAGNOSIS — E785 Hyperlipidemia, unspecified: Secondary | ICD-10-CM | POA: Diagnosis not present

## 2019-05-24 DIAGNOSIS — D519 Vitamin B12 deficiency anemia, unspecified: Secondary | ICD-10-CM | POA: Diagnosis not present

## 2019-05-25 DIAGNOSIS — I10 Essential (primary) hypertension: Secondary | ICD-10-CM | POA: Diagnosis not present

## 2019-05-25 DIAGNOSIS — F411 Generalized anxiety disorder: Secondary | ICD-10-CM | POA: Diagnosis not present

## 2019-05-25 DIAGNOSIS — E785 Hyperlipidemia, unspecified: Secondary | ICD-10-CM | POA: Diagnosis not present

## 2019-05-25 DIAGNOSIS — E039 Hypothyroidism, unspecified: Secondary | ICD-10-CM | POA: Diagnosis not present

## 2019-07-17 DIAGNOSIS — R51 Headache: Secondary | ICD-10-CM | POA: Diagnosis not present

## 2019-07-17 DIAGNOSIS — R030 Elevated blood-pressure reading, without diagnosis of hypertension: Secondary | ICD-10-CM | POA: Diagnosis not present

## 2019-07-18 DIAGNOSIS — R0989 Other specified symptoms and signs involving the circulatory and respiratory systems: Secondary | ICD-10-CM | POA: Diagnosis not present

## 2019-07-18 DIAGNOSIS — R29818 Other symptoms and signs involving the nervous system: Secondary | ICD-10-CM | POA: Diagnosis not present

## 2019-07-18 DIAGNOSIS — I10 Essential (primary) hypertension: Secondary | ICD-10-CM | POA: Diagnosis not present

## 2019-07-18 DIAGNOSIS — D519 Vitamin B12 deficiency anemia, unspecified: Secondary | ICD-10-CM | POA: Diagnosis not present

## 2019-07-30 DIAGNOSIS — R509 Fever, unspecified: Secondary | ICD-10-CM | POA: Diagnosis not present

## 2019-07-30 DIAGNOSIS — Z20828 Contact with and (suspected) exposure to other viral communicable diseases: Secondary | ICD-10-CM | POA: Diagnosis not present

## 2019-07-30 DIAGNOSIS — R05 Cough: Secondary | ICD-10-CM | POA: Diagnosis not present

## 2019-10-26 DIAGNOSIS — E119 Type 2 diabetes mellitus without complications: Secondary | ICD-10-CM | POA: Diagnosis not present

## 2019-11-22 DIAGNOSIS — Z23 Encounter for immunization: Secondary | ICD-10-CM | POA: Diagnosis not present

## 2019-12-14 DIAGNOSIS — E1169 Type 2 diabetes mellitus with other specified complication: Secondary | ICD-10-CM | POA: Diagnosis not present

## 2019-12-14 DIAGNOSIS — M791 Myalgia, unspecified site: Secondary | ICD-10-CM | POA: Diagnosis not present

## 2019-12-14 DIAGNOSIS — Z0000 Encounter for general adult medical examination without abnormal findings: Secondary | ICD-10-CM | POA: Diagnosis not present

## 2019-12-14 DIAGNOSIS — E785 Hyperlipidemia, unspecified: Secondary | ICD-10-CM | POA: Diagnosis not present

## 2019-12-14 DIAGNOSIS — E039 Hypothyroidism, unspecified: Secondary | ICD-10-CM | POA: Diagnosis not present

## 2019-12-14 DIAGNOSIS — E559 Vitamin D deficiency, unspecified: Secondary | ICD-10-CM | POA: Diagnosis not present

## 2019-12-14 DIAGNOSIS — E781 Pure hyperglyceridemia: Secondary | ICD-10-CM | POA: Diagnosis not present

## 2019-12-14 DIAGNOSIS — Z79899 Other long term (current) drug therapy: Secondary | ICD-10-CM | POA: Diagnosis not present

## 2019-12-15 DIAGNOSIS — M170 Bilateral primary osteoarthritis of knee: Secondary | ICD-10-CM | POA: Diagnosis not present

## 2020-01-11 DIAGNOSIS — Z1231 Encounter for screening mammogram for malignant neoplasm of breast: Secondary | ICD-10-CM | POA: Diagnosis not present

## 2020-02-17 DIAGNOSIS — M1712 Unilateral primary osteoarthritis, left knee: Secondary | ICD-10-CM | POA: Diagnosis not present

## 2020-02-28 DIAGNOSIS — M8949 Other hypertrophic osteoarthropathy, multiple sites: Secondary | ICD-10-CM | POA: Diagnosis not present

## 2020-02-28 DIAGNOSIS — M1712 Unilateral primary osteoarthritis, left knee: Secondary | ICD-10-CM | POA: Diagnosis not present

## 2020-02-28 DIAGNOSIS — Z01818 Encounter for other preprocedural examination: Secondary | ICD-10-CM | POA: Diagnosis not present

## 2020-02-28 DIAGNOSIS — E1169 Type 2 diabetes mellitus with other specified complication: Secondary | ICD-10-CM | POA: Diagnosis not present

## 2020-03-12 ENCOUNTER — Other Ambulatory Visit: Payer: Self-pay | Admitting: Orthopedic Surgery

## 2020-03-30 NOTE — Patient Instructions (Addendum)
DUE TO COVID-19 ONLY ONE VISITOR IS ALLOWED TO COME WITH YOU AND STAY IN THE WAITING ROOM ONLY DURING PRE OP AND PROCEDURE DAY OF SURGERY. THE 1 VISITOR MAY VISIT WITH YOU AFTER SURGERY IN YOUR PRIVATE ROOM DURING VISITING HOURS ONLY!  YOU NEED TO HAVE A COVID 19 TEST ON: 04/05/20 @ 11:00 am , THIS TEST MUST BE DONE BEFORE SURGERY, COME  [place], [place] , [address].  ([place]) ONCE YOUR COVID TEST IS COMPLETED, PLEASE BEGIN THE QUARANTINE INSTRUCTIONS AS OUTLINED IN YOUR HANDOUT.                Christina Garner   Your procedure is scheduled on: 04/09/20   Report to [facility] Main  Entrance   Report to short stay at: 5:30 AM     Call this number if you have problems the morning of surgery [phone]    Remember:   NO SOLID FOOD AFTER MIDNIGHT THE NIGHT PRIOR TO SURGERY. NOTHING BY MOUTH EXCEPT CLEAR LIQUIDS UNTIL: 4:15 am . PLEASE FINISH GATORADE DRINK PER SURGEON ORDER  WHICH NEEDS TO BE COMPLETED AT : 4:15 am.   CLEAR LIQUID DIET   Foods Allowed                                                                     Foods Excluded  Coffee and tea, regular and decaf                             liquids that you cannot  Plain Jell-O any favor except red or purple                                           see through such as: Fruit ices (not with fruit pulp)                                     milk, soups, orange juice  Iced Popsicles                                    All solid food Carbonated beverages, regular and diet                                    Cranberry, grape and apple juices Sports drinks like Gatorade Lightly seasoned clear broth or consume(fat free) Sugar, honey syrup  Sample Menu Breakfast                                Lunch                                     Supper Cranberry juice  Beef broth                            Chicken broth Jell-O                                     Grape juice                            Apple juice Coffee or tea                        Jell-O                                      Popsicle                                                Coffee or tea                        Coffee or tea  _____________________________________________________________________  BRUSH YOUR TEETH MORNING OF SURGERY AND RINSE YOUR MOUTH OUT, NO CHEWING GUM CANDY OR MINTS.     Take these medicines the morning of surgery with A SIP OF WATER: famotidine,levothyroxine,omeprazole,sumatriptan as needed.                                 You may not have any metal on your body including hair pins and              piercings  Do not wear jewelry, make-up, lotions, powders or perfumes, deodorant             Do not wear nail polish on your fingernails.  Do not shave  48 hours prior to surgery.         Do not bring valuables to the hospital. [place].  Contacts, dentures or bridgework may not be worn into surgery.  Leave suitcase in the car. After surgery it may be brought to your room.     Patients discharged the day of surgery will not be allowed to drive home. IF YOU ARE HAVING SURGERY AND GOING HOME THE SAME DAY, YOU MUST HAVE AN ADULT TO DRIVE YOU HOME AND BE WITH YOU FOR 24 HOURS. YOU MAY GO HOME BY TAXI OR UBER OR ORTHERWISE, BUT AN ADULT MUST ACCOMPANY YOU HOME AND STAY WITH YOU FOR 24 HOURS.  Name and phone number of your driver:  Special Instructions: N/A              Please read over the following fact sheets you were given: _____________________________________________________________________  [facility] - Preparing for Surgery Before surgery, you can play an important role.  Because skin is not sterile, your skin needs to be as free of germs as possible.  You can reduce the number of germs on your skin by washing with CHG (chlorahexidine gluconate) soap before surgery.  CHG is  an antiseptic cleaner which kills germs and bonds with the skin  to continue killing germs even after washing. Please DO NOT use if you have an allergy to CHG or antibacterial soaps.  If your skin becomes reddened/irritated stop using the CHG and inform your nurse when you arrive at Short Stay. Do not shave (including legs and underarms) for at least 48 hours prior to the first CHG shower.  You may shave your face/neck. Please follow these instructions carefully:  1.  Shower with CHG Soap the night before surgery and the  morning of Surgery.  2.  If you choose to wash your hair, wash your hair first as usual with your  normal  shampoo.  3.  After you shampoo, rinse your hair and body thoroughly to remove the  shampoo.                           4.  Use CHG as you would any other liquid soap.  You can apply chg directly  to the skin and wash                       Gently with a scrungie or clean washcloth.  5.  Apply the CHG Soap to your body ONLY FROM THE NECK DOWN.   Do not use on face/ open                           Wound or open sores. Avoid contact with eyes, ears mouth and genitals (private parts).                       Wash face,  Genitals (private parts) with your normal soap.             6.  Wash thoroughly, paying special attention to the area where your surgery  will be performed.  7.  Thoroughly rinse your body with warm water from the neck down.  8.  DO NOT shower/wash with your normal soap after using and rinsing off  the CHG Soap.                9.  Pat yourself dry with a clean towel.            10.  Wear clean pajamas.            11.  Place clean sheets on your bed the night of your first shower and do not  sleep with pets. Day of Surgery : Do not apply any lotions/deodorants the morning of surgery.  Please wear clean clothes to the hospital/surgery center.  FAILURE TO FOLLOW THESE INSTRUCTIONS MAY RESULT IN THE CANCELLATION OF YOUR SURGERY PATIENT SIGNATURE_________________________________  NURSE  SIGNATURE__________________________________  ________________________________________________________________________   Adam Phenix  An incentive spirometer is a tool that can help keep your lungs clear and active. This tool measures how well you are filling your lungs with each breath. Taking long deep breaths may help reverse or decrease the chance of developing breathing (pulmonary) problems (especially infection) following:  A long period of time when you are unable to move or be active. BEFORE THE PROCEDURE   If the spirometer includes an indicator to show your best effort, your nurse or respiratory therapist will set it to a desired goal.  If possible, sit up straight or lean slightly forward. Try not to slouch.  Hold  the incentive spirometer in an upright position. INSTRUCTIONS FOR USE  1. Sit on the edge of your bed if possible, or sit up as far as you can in bed or on a chair. 2. Hold the incentive spirometer in an upright position. 3. Breathe out normally. 4. Place the mouthpiece in your mouth and seal your lips tightly around it. 5. Breathe in slowly and as deeply as possible, raising the piston or the ball toward the top of the column. 6. Hold your breath for 3-5 seconds or for as long as possible. Allow the piston or ball to fall to the bottom of the column. 7. Remove the mouthpiece from your mouth and breathe out normally. 8. Rest for a few seconds and repeat Steps 1 through 7 at least 10 times every 1-2 hours when you are awake. Take your time and take a few normal breaths between deep breaths. 9. The spirometer may include an indicator to show your best effort. Use the indicator as a goal to work toward during each repetition. 10. After each set of 10 deep breaths, practice coughing to be sure your lungs are clear. If you have an incision (the cut made at the time of surgery), support your incision when coughing by placing a pillow or rolled up towels firmly  against it. Once you are able to get out of bed, walk around indoors and cough well. You may stop using the incentive spirometer when instructed by your caregiver.  RISKS AND COMPLICATIONS  Take your time so you do not get dizzy or light-headed.  If you are in pain, you may need to take or ask for pain medication before doing incentive spirometry. It is harder to take a deep breath if you are having pain. AFTER USE  Rest and breathe slowly and easily.  It can be helpful to keep track of a log of your progress. Your caregiver can provide you with a simple table to help with this. If you are using the spirometer at home, follow these instructions: [place] IF:   You are having difficultly using the spirometer.  You have trouble using the spirometer as often as instructed.  Your pain medication is not giving enough relief while using the spirometer.  You develop fever of 100.5 F (38.1 C) or higher. SEEK IMMEDIATE MEDICAL CARE IF:   You cough up bloody sputum that had not been present before.  You develop fever of 102 F (38.9 C) or greater.  You develop worsening pain at or near the incision site. MAKE SURE YOU:   Understand these instructions.  Will watch your condition.  Will get help right away if you are not doing well or get worse. Document Released: 02/16/2007 Document Revised: 12/29/2011 Document Reviewed: 04/19/2007 [facility] Patient Information 2014 [place], [place].   ________________________________________________________________________

## 2020-04-02 ENCOUNTER — Encounter (HOSPITAL_COMMUNITY)
Admission: RE | Admit: 2020-04-02 | Discharge: 2020-04-02 | Disposition: A | Discharge status: Home / Self Care | Payer: Medicare Other | Source: Ambulatory Visit | Attending: Orthopedic Surgery | Admitting: Orthopedic Surgery

## 2020-04-02 ENCOUNTER — Other Ambulatory Visit: Payer: Self-pay

## 2020-04-02 ENCOUNTER — Encounter (HOSPITAL_COMMUNITY): Payer: Self-pay

## 2020-04-02 DIAGNOSIS — Z01818 Encounter for other preprocedural examination: Secondary | ICD-10-CM | POA: Insufficient documentation

## 2020-04-02 HISTORY — DX: Prediabetes: R73.03

## 2020-04-02 HISTORY — DX: Malignant (primary) neoplasm, unspecified: C80.1

## 2020-04-02 HISTORY — DX: Unspecified osteoarthritis, unspecified site: M19.90

## 2020-04-02 HISTORY — DX: Pneumonia, unspecified organism: J18.9

## 2020-04-02 LAB — COMPREHENSIVE METABOLIC PANEL
ALT: 21 U/L (ref 0–44)
AST: 24 U/L (ref 15–41)
Albumin: 4.4 g/dL (ref 3.5–5.0)
Alkaline Phosphatase: 46 U/L (ref 38–126)
Anion gap: 13 (ref 5–15)
BUN: 23 mg/dL (ref 8–23)
CO2: 25 mmol/L (ref 22–32)
Calcium: 9.3 mg/dL (ref 8.9–10.3)
Chloride: 101 mmol/L (ref 98–111)
Creatinine, Ser: 1.17 mg/dL — ABNORMAL HIGH (ref 0.44–1.00)
GFR calc Af Amer: 55 mL/min — ABNORMAL LOW (ref >60)
GFR calc non Af Amer: 47 mL/min — ABNORMAL LOW (ref >60)
Glucose, Bld: 156 mg/dL — ABNORMAL HIGH (ref 70–99)
Potassium: 3.1 mmol/L — ABNORMAL LOW (ref 3.5–5.1)
Sodium: 139 mmol/L (ref 135–145)
Total Bilirubin: 0.9 mg/dL (ref 0.3–1.2)
Total Protein: 6.9 g/dL (ref 6.5–8.1)

## 2020-04-02 LAB — CBC WITH DIFFERENTIAL/PLATELET
Abs Immature Granulocytes: 0.03 10*3/uL (ref 0.00–0.07)
Basophils Absolute: 0.1 10*3/uL (ref 0.0–0.1)
Basophils Relative: 1 %
Eosinophils Absolute: 0.5 10*3/uL (ref 0.0–0.5)
Eosinophils Relative: 7 %
HCT: 39.7 % (ref 36.0–46.0)
Hemoglobin: 13.5 g/dL (ref 12.0–15.0)
Immature Granulocytes: 0 %
Lymphocytes Relative: 32 %
Lymphs Abs: 2.2 10*3/uL (ref 0.7–4.0)
MCH: 30.8 pg (ref 26.0–34.0)
MCHC: 34 g/dL (ref 30.0–36.0)
MCV: 90.6 fL (ref 80.0–100.0)
Monocytes Absolute: 0.8 10*3/uL (ref 0.1–1.0)
Monocytes Relative: 12 %
Neutro Abs: 3.3 10*3/uL (ref 1.7–7.7)
Neutrophils Relative %: 48 %
Platelets: 246 10*3/uL (ref 150–400)
RBC: 4.38 MIL/uL (ref 3.87–5.11)
RDW: 12.8 % (ref 11.5–15.5)
WBC: 6.9 10*3/uL (ref 4.0–10.5)
nRBC: 0 % (ref 0.0–0.2)

## 2020-04-02 LAB — SURGICAL PCR SCREEN
MRSA, PCR: NEGATIVE
Staphylococcus aureus: NEGATIVE

## 2020-04-02 NOTE — Progress Notes (Addendum)
COVID Vaccine Completed:yes Date COVID Vaccine completed:11/22/19 COVID vaccine manufacturer: Pfizer    *[employer]   PCP - Dr. Christa See. Cardiologist -   Chest x-ray -  EKG -  [id number].: chart Stress Test -  ECHO -  Cardiac Cath -   Sleep Study -  CPAP -   Fasting Blood Sugar -  Checks Blood Sugar _____ times a day  Blood Thinner Instructions: Aspirin Instructions: Last Dose:  Anesthesia review:   Patient denies shortness of breath, fever, cough and chest pain at PAT appointment   Patient verbalized understanding of instructions that were given to them at the PAT appointment. Patient was also instructed that they will need to review over the PAT instructions again at home before surgery.

## 2020-04-03 NOTE — Progress Notes (Signed)
Lab results: potassium: 3.1

## 2020-04-05 ENCOUNTER — Other Ambulatory Visit (HOSPITAL_COMMUNITY)
Admission: RE | Admit: 2020-04-05 | Discharge: 2020-04-05 | Disposition: A | Discharge status: Home / Self Care | Payer: Medicare Other | Source: Ambulatory Visit | Attending: Orthopedic Surgery | Admitting: Orthopedic Surgery

## 2020-04-05 DIAGNOSIS — Z01812 Encounter for preprocedural laboratory examination: Secondary | ICD-10-CM | POA: Insufficient documentation

## 2020-04-05 DIAGNOSIS — M79672 Pain in left foot: Secondary | ICD-10-CM | POA: Diagnosis not present

## 2020-04-05 DIAGNOSIS — Z20822 Contact with and (suspected) exposure to covid-19: Secondary | ICD-10-CM | POA: Insufficient documentation

## 2020-04-05 LAB — SARS CORONAVIRUS 2 (TAT 6-24 HRS): SARS Coronavirus 2: NEGATIVE

## 2020-04-06 NOTE — Anesthesia Preprocedure Evaluation (Addendum)
Anesthesia Evaluation    Reviewed: Allergy & Precautions, Patient's Chart, lab work & pertinent test results  History of Anesthesia Complications Negative for: history of anesthetic complications  Airway Mallampati: II  TM Distance: >3 FB Neck ROM: Full    Dental  (+) Edentulous Upper, Partial Lower   Pulmonary asthma , former smoker,    Pulmonary exam normal        Cardiovascular hypertension, Pt. on medications and Pt. on home beta blockers Normal cardiovascular exam     Neuro/Psych  Headaches, PSYCHIATRIC DISORDERS Anxiety    GI/Hepatic Neg liver ROS, GERD  Medicated,  Endo/Other  Hypothyroidism  Obesity Pre-DM   Renal/GU negative Renal ROS     Musculoskeletal  (+) Arthritis ,   Abdominal   Peds  Hematology negative hematology ROS (+)  Plt 246 Hx of "easy bleeding", but negative workup for any clotting disorder ~10 years ago    Anesthesia Other Findings Covid test negative   Reproductive/Obstetrics                            Anesthesia Physical Anesthesia Plan  ASA: III  Anesthesia Plan: Spinal   Post-op Pain Management:  Regional for Post-op pain   Induction:   PONV Risk Score and Plan: 2 and Treatment may vary due to age or medical condition and Propofol infusion  Airway Management Planned: Natural Airway and Simple Face Mask  Additional Equipment: None  Intra-op Plan:   Post-operative Plan:   Informed Consent: I have reviewed the patients History and Physical, chart, labs and discussed the procedure including the risks, benefits and alternatives for the proposed anesthesia with the patient or authorized representative who has indicated his/her understanding and acceptance.       Plan Discussed with: CRNA and Anesthesiologist  Anesthesia Plan Comments: (Labs reviewed, platelets acceptable. Discussed risks and benefits of spinal, including spinal/epidural  hematoma, infection, failed block, and PDPH. Patient expressed understanding and wished to proceed. )       Anesthesia Quick Evaluation

## 2020-04-08 MED ORDER — BUPIVACAINE LIPOSOME 1.3 % IJ SUSP
20.0000 mL | INTRAMUSCULAR | Status: DC
  Filled 2020-04-08: qty 20

## 2020-04-09 ENCOUNTER — Observation Stay (HOSPITAL_COMMUNITY)
Admission: RE | Admit: 2020-04-09 | Discharge: 2020-04-10 | Disposition: A | Discharge status: Home Health | Payer: Medicare Other | Attending: Orthopedic Surgery | Admitting: Orthopedic Surgery

## 2020-04-09 ENCOUNTER — Encounter (HOSPITAL_COMMUNITY)
Admission: RE | Disposition: A | Discharge status: Home Health | Payer: Self-pay | Source: Home / Self Care | Attending: Orthopedic Surgery

## 2020-04-09 ENCOUNTER — Other Ambulatory Visit: Payer: Self-pay

## 2020-04-09 ENCOUNTER — Ambulatory Visit (HOSPITAL_COMMUNITY): Payer: Medicare Other | Admitting: Anesthesiology

## 2020-04-09 ENCOUNTER — Encounter (HOSPITAL_COMMUNITY): Payer: Self-pay | Admitting: Orthopedic Surgery

## 2020-04-09 DIAGNOSIS — M1712 Unilateral primary osteoarthritis, left knee: Principal | ICD-10-CM | POA: Insufficient documentation

## 2020-04-09 DIAGNOSIS — Z79899 Other long term (current) drug therapy: Secondary | ICD-10-CM | POA: Insufficient documentation

## 2020-04-09 DIAGNOSIS — Z880 Allergy status to penicillin: Secondary | ICD-10-CM | POA: Diagnosis not present

## 2020-04-09 DIAGNOSIS — N189 Chronic kidney disease, unspecified: Secondary | ICD-10-CM | POA: Diagnosis not present

## 2020-04-09 DIAGNOSIS — R7303 Prediabetes: Secondary | ICD-10-CM | POA: Diagnosis not present

## 2020-04-09 DIAGNOSIS — F411 Generalized anxiety disorder: Secondary | ICD-10-CM | POA: Insufficient documentation

## 2020-04-09 DIAGNOSIS — Z96659 Presence of unspecified artificial knee joint: Secondary | ICD-10-CM

## 2020-04-09 DIAGNOSIS — I129 Hypertensive chronic kidney disease with stage 1 through stage 4 chronic kidney disease, or unspecified chronic kidney disease: Secondary | ICD-10-CM | POA: Diagnosis not present

## 2020-04-09 DIAGNOSIS — E669 Obesity, unspecified: Secondary | ICD-10-CM | POA: Insufficient documentation

## 2020-04-09 DIAGNOSIS — E039 Hypothyroidism, unspecified: Secondary | ICD-10-CM | POA: Diagnosis not present

## 2020-04-09 DIAGNOSIS — Z6835 Body mass index (BMI) 35.0-35.9, adult: Secondary | ICD-10-CM | POA: Diagnosis not present

## 2020-04-09 DIAGNOSIS — Z79890 Hormone replacement therapy (postmenopausal): Secondary | ICD-10-CM | POA: Diagnosis not present

## 2020-04-09 DIAGNOSIS — Z888 Allergy status to other drugs, medicaments and biological substances status: Secondary | ICD-10-CM | POA: Diagnosis not present

## 2020-04-09 DIAGNOSIS — K219 Gastro-esophageal reflux disease without esophagitis: Secondary | ICD-10-CM | POA: Insufficient documentation

## 2020-04-09 DIAGNOSIS — Z87891 Personal history of nicotine dependence: Secondary | ICD-10-CM | POA: Diagnosis not present

## 2020-04-09 HISTORY — PX: TOTAL KNEE ARTHROPLASTY: SHX125

## 2020-04-09 LAB — GLUCOSE, CAPILLARY: Glucose-Capillary: 113 mg/dL — ABNORMAL HIGH (ref 70–99)

## 2020-04-09 SURGERY — ARTHROPLASTY, KNEE, TOTAL
Anesthesia: Spinal | Site: Knee | Laterality: Left

## 2020-04-09 MED ORDER — HYDROCHLOROTHIAZIDE 25 MG PO TABS: 50.0000 mg | ORAL_TABLET | Freq: Every day | ORAL | Status: DC | PRN

## 2020-04-09 MED ORDER — SODIUM CHLORIDE 0.9 % IV SOLN: INTRAVENOUS | Status: DC

## 2020-04-09 MED ORDER — PROPOFOL 10 MG/ML IV BOLUS
INTRAVENOUS | Status: DC | PRN
  Administered 2020-04-09: 20 mg via INTRAVENOUS

## 2020-04-09 MED ORDER — SODIUM CHLORIDE 0.9% FLUSH
INTRAVENOUS | Status: DC | PRN
  Administered 2020-04-09: 20 mL

## 2020-04-09 MED ORDER — BUPIVACAINE IN DEXTROSE 0.75-8.25 % IT SOLN
INTRATHECAL | Status: DC | PRN
Start: 2020-04-09 — End: 2020-04-09
  Administered 2020-04-09: 1.6 mL via INTRATHECAL

## 2020-04-09 MED ORDER — DOCUSATE SODIUM 100 MG PO CAPS
100.0000 mg | ORAL_CAPSULE | Freq: Two times a day (BID) | ORAL | Status: DC
  Administered 2020-04-09 – 2020-04-10 (×3): 100 mg via ORAL
  Filled 2020-04-09 (×3): qty 1

## 2020-04-09 MED ORDER — ATENOLOL 50 MG PO TABS
50.0000 mg | ORAL_TABLET | Freq: Once | ORAL | Status: AC
  Administered 2020-04-09: 50 mg via ORAL
  Filled 2020-04-09: qty 1

## 2020-04-09 MED ORDER — ONDANSETRON HCL 4 MG PO TABS: 4.0000 mg | ORAL_TABLET | Freq: Four times a day (QID) | ORAL | Status: DC | PRN

## 2020-04-09 MED ORDER — BUPIVACAINE-EPINEPHRINE 0.25% -1:200000 IJ SOLN
INTRAMUSCULAR | Status: DC | PRN
  Administered 2020-04-09: 30 mL

## 2020-04-09 MED ORDER — BISACODYL 5 MG PO TBEC: 5.0000 mg | DELAYED_RELEASE_TABLET | Freq: Every day | ORAL | Status: DC | PRN

## 2020-04-09 MED ORDER — MIDAZOLAM HCL 5 MG/5ML IJ SOLN
INTRAMUSCULAR | Status: DC | PRN
  Administered 2020-04-09 (×2): 1 mg via INTRAVENOUS

## 2020-04-09 MED ORDER — CHLORHEXIDINE GLUCONATE 0.12 % MT SOLN
15.0000 mL | Freq: Once | OROMUCOSAL | Status: AC
  Administered 2020-04-09: 15 mL via OROMUCOSAL

## 2020-04-09 MED ORDER — FENTANYL CITRATE (PF) 100 MCG/2ML IJ SOLN: 25.0000 ug | INTRAMUSCULAR | Status: DC | PRN

## 2020-04-09 MED ORDER — SENNOSIDES-DOCUSATE SODIUM 8.6-50 MG PO TABS: 1.0000 | ORAL_TABLET | Freq: Every evening | ORAL | Status: DC | PRN

## 2020-04-09 MED ORDER — DIPHENHYDRAMINE HCL 12.5 MG/5ML PO ELIX: 12.5000 mg | ORAL_SOLUTION | ORAL | Status: DC | PRN

## 2020-04-09 MED ORDER — CELECOXIB 200 MG PO CAPS
400.0000 mg | ORAL_CAPSULE | Freq: Once | ORAL | Status: AC
  Administered 2020-04-09: 400 mg via ORAL
  Filled 2020-04-09: qty 2

## 2020-04-09 MED ORDER — POVIDONE-IODINE 10 % EX SWAB
2.0000 "application " | Freq: Once | CUTANEOUS | Status: AC
  Administered 2020-04-09: 2 via TOPICAL

## 2020-04-09 MED ORDER — ONDANSETRON HCL 4 MG/2ML IJ SOLN: 4.0000 mg | Freq: Once | INTRAMUSCULAR | Status: DC | PRN

## 2020-04-09 MED ORDER — FERROUS SULFATE 325 (65 FE) MG PO TABS
325.0000 mg | ORAL_TABLET | Freq: Three times a day (TID) | ORAL | Status: DC
  Administered 2020-04-09 – 2020-04-10 (×3): 325 mg via ORAL
  Filled 2020-04-09 (×3): qty 1

## 2020-04-09 MED ORDER — SODIUM CHLORIDE (PF) 0.9 % IJ SOLN
INTRAMUSCULAR | Status: AC
  Filled 2020-04-09: qty 20

## 2020-04-09 MED ORDER — BUPIVACAINE LIPOSOME 1.3 % IJ SUSP
INTRAMUSCULAR | Status: DC | PRN
  Administered 2020-04-09: 20 mL

## 2020-04-09 MED ORDER — WATER FOR IRRIGATION, STERILE IR SOLN
Status: DC | PRN
  Administered 2020-04-09: 2000 mL

## 2020-04-09 MED ORDER — ACETAMINOPHEN 500 MG PO TABS
1000.0000 mg | ORAL_TABLET | Freq: Four times a day (QID) | ORAL | Status: AC
  Administered 2020-04-09 – 2020-04-10 (×4): 1000 mg via ORAL
  Filled 2020-04-09 (×4): qty 2

## 2020-04-09 MED ORDER — ZOLPIDEM TARTRATE 5 MG PO TABS: 5.0000 mg | ORAL_TABLET | Freq: Every evening | ORAL | Status: DC | PRN

## 2020-04-09 MED ORDER — ALUM & MAG HYDROXIDE-SIMETH 200-200-20 MG/5ML PO SUSP: 30.0000 mL | ORAL | Status: DC | PRN

## 2020-04-09 MED ORDER — DEXAMETHASONE SODIUM PHOSPHATE 10 MG/ML IJ SOLN
INTRAMUSCULAR | Status: AC
  Filled 2020-04-09: qty 1

## 2020-04-09 MED ORDER — TRANEXAMIC ACID-NACL 1000-0.7 MG/100ML-% IV SOLN
1000.0000 mg | Freq: Once | INTRAVENOUS | Status: AC
  Administered 2020-04-09: 1000 mg via INTRAVENOUS
  Filled 2020-04-09: qty 100

## 2020-04-09 MED ORDER — ALPRAZOLAM 1 MG PO TABS
1.0000 mg | ORAL_TABLET | Freq: Every day | ORAL | Status: DC
  Filled 2020-04-09: qty 1

## 2020-04-09 MED ORDER — SUMATRIPTAN SUCCINATE 50 MG PO TABS
100.0000 mg | ORAL_TABLET | ORAL | Status: DC | PRN
  Filled 2020-04-09: qty 2

## 2020-04-09 MED ORDER — OXYCODONE HCL 5 MG/5ML PO SOLN: 5.0000 mg | Freq: Once | ORAL | Status: DC | PRN

## 2020-04-09 MED ORDER — LACTATED RINGERS IV SOLN: INTRAVENOUS | Status: DC

## 2020-04-09 MED ORDER — ROPIVACAINE HCL 7.5 MG/ML IJ SOLN
INTRAMUSCULAR | Status: DC | PRN
  Administered 2020-04-09: 20 mL via PERINEURAL

## 2020-04-09 MED ORDER — PHENOL 1.4 % MT LIQD: 1.0000 | OROMUCOSAL | Status: DC | PRN

## 2020-04-09 MED ORDER — SODIUM CHLORIDE 0.9 % IR SOLN
Status: DC | PRN
  Administered 2020-04-09: 1000 mL

## 2020-04-09 MED ORDER — ONDANSETRON HCL 4 MG/2ML IJ SOLN: 4.0000 mg | Freq: Four times a day (QID) | INTRAMUSCULAR | Status: DC | PRN

## 2020-04-09 MED ORDER — ATENOLOL 50 MG PO TABS
50.0000 mg | ORAL_TABLET | Freq: Two times a day (BID) | ORAL | Status: DC
  Administered 2020-04-09 – 2020-04-10 (×2): 50 mg via ORAL
  Filled 2020-04-09 (×2): qty 1

## 2020-04-09 MED ORDER — PROPOFOL 500 MG/50ML IV EMUL
INTRAVENOUS | Status: DC | PRN
  Administered 2020-04-09: 75 ug/kg/min via INTRAVENOUS

## 2020-04-09 MED ORDER — FAMOTIDINE 20 MG PO TABS
40.0000 mg | ORAL_TABLET | Freq: Every day | ORAL | Status: DC
  Administered 2020-04-09: 40 mg via ORAL
  Filled 2020-04-09: qty 2

## 2020-04-09 MED ORDER — FLEET ENEMA 7-19 GM/118ML RE ENEM: 1.0000 | ENEMA | Freq: Once | RECTAL | Status: DC | PRN

## 2020-04-09 MED ORDER — BUPIVACAINE HCL 0.25 % IJ SOLN
INTRAMUSCULAR | Status: AC
  Filled 2020-04-09: qty 1

## 2020-04-09 MED ORDER — ONDANSETRON HCL 4 MG/2ML IJ SOLN
INTRAMUSCULAR | Status: AC
  Filled 2020-04-09: qty 2

## 2020-04-09 MED ORDER — ONDANSETRON HCL 4 MG/2ML IJ SOLN
INTRAMUSCULAR | Status: DC | PRN
  Administered 2020-04-09: 4 mg via INTRAVENOUS

## 2020-04-09 MED ORDER — EPHEDRINE 5 MG/ML INJ
INTRAVENOUS | Status: AC
  Filled 2020-04-09: qty 10

## 2020-04-09 MED ORDER — TRAMADOL HCL 50 MG PO TABS
50.0000 mg | ORAL_TABLET | Freq: Four times a day (QID) | ORAL | Status: DC
  Administered 2020-04-09 – 2020-04-10 (×5): 50 mg via ORAL
  Filled 2020-04-09 (×5): qty 1

## 2020-04-09 MED ORDER — PANTOPRAZOLE SODIUM 40 MG PO TBEC: 40.0000 mg | DELAYED_RELEASE_TABLET | Freq: Every day | ORAL | Status: DC

## 2020-04-09 MED ORDER — OXYCODONE HCL 5 MG PO TABS
5.0000 mg | ORAL_TABLET | ORAL | Status: DC | PRN
  Administered 2020-04-09 – 2020-04-10 (×3): 10 mg via ORAL
  Filled 2020-04-09 (×3): qty 2

## 2020-04-09 MED ORDER — FENTANYL CITRATE (PF) 100 MCG/2ML IJ SOLN
INTRAMUSCULAR | Status: DC | PRN
  Administered 2020-04-09 (×2): 50 ug via INTRAVENOUS

## 2020-04-09 MED ORDER — MENTHOL 3 MG MT LOZG: 1.0000 | LOZENGE | OROMUCOSAL | Status: DC | PRN

## 2020-04-09 MED ORDER — VENLAFAXINE HCL ER 150 MG PO CP24
150.0000 mg | ORAL_CAPSULE | Freq: Every day | ORAL | Status: DC
  Administered 2020-04-09 – 2020-04-10 (×2): 150 mg via ORAL
  Filled 2020-04-09 (×2): qty 1

## 2020-04-09 MED ORDER — GABAPENTIN 300 MG PO CAPS
300.0000 mg | ORAL_CAPSULE | Freq: Three times a day (TID) | ORAL | Status: DC
  Filled 2020-04-09: qty 1

## 2020-04-09 MED ORDER — PHENYLEPHRINE HCL (PRESSORS) 10 MG/ML IV SOLN
INTRAVENOUS | Status: AC
  Filled 2020-04-09: qty 1

## 2020-04-09 MED ORDER — FENTANYL CITRATE (PF) 100 MCG/2ML IJ SOLN
INTRAMUSCULAR | Status: AC
  Filled 2020-04-09: qty 2

## 2020-04-09 MED ORDER — CEFAZOLIN SODIUM-DEXTROSE 2-4 GM/100ML-% IV SOLN
2.0000 g | INTRAVENOUS | Status: AC
  Administered 2020-04-09: 2 g via INTRAVENOUS
  Filled 2020-04-09: qty 100

## 2020-04-09 MED ORDER — ACETAMINOPHEN 500 MG PO TABS
1000.0000 mg | ORAL_TABLET | Freq: Once | ORAL | Status: AC
  Administered 2020-04-09: 1000 mg via ORAL
  Filled 2020-04-09: qty 2

## 2020-04-09 MED ORDER — CEFAZOLIN SODIUM-DEXTROSE 2-4 GM/100ML-% IV SOLN
2.0000 g | Freq: Four times a day (QID) | INTRAVENOUS | Status: AC
  Administered 2020-04-09 (×2): 2 g via INTRAVENOUS
  Filled 2020-04-09 (×2): qty 100

## 2020-04-09 MED ORDER — METOCLOPRAMIDE HCL 5 MG PO TABS: 5.0000 mg | ORAL_TABLET | Freq: Three times a day (TID) | ORAL | Status: DC | PRN

## 2020-04-09 MED ORDER — ASPIRIN EC 325 MG PO TBEC
325.0000 mg | DELAYED_RELEASE_TABLET | Freq: Two times a day (BID) | ORAL | Status: DC
  Administered 2020-04-10: 325 mg via ORAL
  Filled 2020-04-09: qty 1

## 2020-04-09 MED ORDER — TRANEXAMIC ACID-NACL 1000-0.7 MG/100ML-% IV SOLN
1000.0000 mg | INTRAVENOUS | Status: AC
  Administered 2020-04-09: 1000 mg via INTRAVENOUS
  Filled 2020-04-09: qty 100

## 2020-04-09 MED ORDER — OXYCODONE HCL 5 MG PO TABS: 5.0000 mg | ORAL_TABLET | Freq: Once | ORAL | Status: DC | PRN

## 2020-04-09 MED ORDER — HYDROMORPHONE HCL 1 MG/ML IJ SOLN: 0.5000 mg | INTRAMUSCULAR | Status: DC | PRN

## 2020-04-09 MED ORDER — EPHEDRINE SULFATE-NACL 50-0.9 MG/10ML-% IV SOSY
PREFILLED_SYRINGE | INTRAVENOUS | Status: DC | PRN
  Administered 2020-04-09 (×2): 10 mg via INTRAVENOUS

## 2020-04-09 MED ORDER — MIDAZOLAM HCL 2 MG/2ML IJ SOLN
INTRAMUSCULAR | Status: AC
  Filled 2020-04-09: qty 2

## 2020-04-09 MED ORDER — PHENYLEPHRINE HCL-NACL 10-0.9 MG/250ML-% IV SOLN
INTRAVENOUS | Status: DC | PRN
Start: 2020-04-09 — End: 2020-04-09
  Administered 2020-04-09: 50 ug/min via INTRAVENOUS

## 2020-04-09 MED ORDER — DEXAMETHASONE SODIUM PHOSPHATE 10 MG/ML IJ SOLN
8.0000 mg | Freq: Once | INTRAMUSCULAR | Status: AC
  Administered 2020-04-09: 8 mg via INTRAVENOUS

## 2020-04-09 MED ORDER — ORAL CARE MOUTH RINSE: 15.0000 mL | Freq: Once | OROMUCOSAL | Status: AC

## 2020-04-09 MED ORDER — METOCLOPRAMIDE HCL 5 MG/ML IJ SOLN: 5.0000 mg | Freq: Three times a day (TID) | INTRAMUSCULAR | Status: DC | PRN

## 2020-04-09 MED ORDER — LOSARTAN POTASSIUM 50 MG PO TABS
100.0000 mg | ORAL_TABLET | Freq: Every day | ORAL | Status: DC
  Administered 2020-04-10: 100 mg via ORAL
  Filled 2020-04-09: qty 2

## 2020-04-09 MED ORDER — DEXAMETHASONE SODIUM PHOSPHATE 10 MG/ML IJ SOLN
10.0000 mg | Freq: Once | INTRAMUSCULAR | Status: AC
  Administered 2020-04-10: 10 mg via INTRAVENOUS
  Filled 2020-04-09: qty 1

## 2020-04-09 MED ORDER — LEVOTHYROXINE SODIUM 125 MCG PO TABS
125.0000 ug | ORAL_TABLET | Freq: Every day | ORAL | Status: DC
  Administered 2020-04-10: 125 ug via ORAL
  Filled 2020-04-09: qty 1

## 2020-04-09 SURGICAL SUPPLY — 61 items
ARTISURF 10M PLY L 6-9EF KNEE (Knees) ×3 IMPLANT
BAG ZIPLOCK 12X15 (MISCELLANEOUS) ×3 IMPLANT
BLADE SAGITTAL 13X1.27X60 (BLADE) ×2 IMPLANT
BLADE SAGITTAL 13X1.27X60MM (BLADE) ×1
BLADE SAW SGTL 83.5X18.5 (BLADE) ×3 IMPLANT
BLADE SURG 15 STRL LF DISP TIS (BLADE) ×1 IMPLANT
BLADE SURG 15 STRL SS (BLADE) ×3
BLADE SURG SZ10 CARB STEEL (BLADE) ×6 IMPLANT
BNDG ELASTIC 6X5.8 VLCR STR LF (GAUZE/BANDAGES/DRESSINGS) ×3 IMPLANT
BOWL SMART MIX CTS (DISPOSABLE) ×3 IMPLANT
CEMENT BONE SIMPLEX SPEEDSET (Cement) ×6 IMPLANT
CLOSURE STERI-STRIP 1/2X4 (GAUZE/BANDAGES/DRESSINGS) ×2
CLOSURE WOUND 1/2 X4 (GAUZE/BANDAGES/DRESSINGS) ×1
CLSR STERI-STRIP ANTIMIC 1/2X4 (GAUZE/BANDAGES/DRESSINGS) ×4 IMPLANT
COVER SURGICAL LIGHT HANDLE (MISCELLANEOUS) ×3 IMPLANT
COVER WAND RF STERILE (DRAPES) IMPLANT
CUFF TOURN SGL QUICK 34 (TOURNIQUET CUFF) ×3
CUFF TRNQT CYL 34X4.125X (TOURNIQUET CUFF) ×1 IMPLANT
DECANTER SPIKE VIAL GLASS SM (MISCELLANEOUS) ×6 IMPLANT
DRAPE INCISE IOBAN 66X45 STRL (DRAPES) ×6 IMPLANT
DRAPE U-SHAPE 47X51 STRL (DRAPES) ×3 IMPLANT
DRSG AQUACEL AG ADV 3.5X10 (GAUZE/BANDAGES/DRESSINGS) ×3 IMPLANT
DURAPREP 26ML APPLICATOR (WOUND CARE) ×6 IMPLANT
ELECT REM PT RETURN 15FT ADLT (MISCELLANEOUS) ×3 IMPLANT
FEMUR  CMT CCR STD SZ8 L KNEE (Knees) ×3 IMPLANT
FEMUR CMT CCR STD SZ8 L KNEE (Knees) ×1 IMPLANT
FEMUR CMTD CCR STD SZ8 L KNEE (Knees) ×1 IMPLANT
GLOVE BIOGEL M STRL SZ7.5 (GLOVE) ×3 IMPLANT
GLOVE BIOGEL PI IND STRL 7.5 (GLOVE) ×1 IMPLANT
GLOVE BIOGEL PI IND STRL 8.5 (GLOVE) ×2 IMPLANT
GLOVE BIOGEL PI INDICATOR 7.5 (GLOVE) ×2
GLOVE BIOGEL PI INDICATOR 8.5 (GLOVE) ×4
GLOVE SURG ORTHO 8.0 STRL STRW (GLOVE) ×9 IMPLANT
GOWN STRL REUS W/ TWL XL LVL3 (GOWN DISPOSABLE) ×2 IMPLANT
GOWN STRL REUS W/TWL XL LVL3 (GOWN DISPOSABLE) ×6
HANDPIECE INTERPULSE COAX TIP (DISPOSABLE) ×3
HOLDER FOLEY CATH W/STRAP (MISCELLANEOUS) ×3 IMPLANT
HOOD PEEL AWAY FLYTE STAYCOOL (MISCELLANEOUS) ×9 IMPLANT
KIT TURNOVER KIT A (KITS) IMPLANT
MANIFOLD NEPTUNE II (INSTRUMENTS) ×3 IMPLANT
NEEDLE HYPO 22GX1.5 SAFETY (NEEDLE) ×3 IMPLANT
NS IRRIG 1000ML POUR BTL (IV SOLUTION) ×3 IMPLANT
PACK TOTAL KNEE CUSTOM (KITS) ×3 IMPLANT
PENCIL SMOKE EVACUATOR (MISCELLANEOUS) ×3 IMPLANT
PROTECTOR NERVE ULNAR (MISCELLANEOUS) ×3 IMPLANT
SET HNDPC FAN SPRY TIP SCT (DISPOSABLE) ×1 IMPLANT
STEM POLY PAT PLY 32M KNEE (Knees) ×3 IMPLANT
STEM TIBIA 5 DEG SZ E L KNEE (Knees) ×1 IMPLANT
STRIP CLOSURE SKIN 1/2X4 (GAUZE/BANDAGES/DRESSINGS) ×2 IMPLANT
SUT BONE WAX W31G (SUTURE) ×3 IMPLANT
SUT MNCRL AB 3-0 PS2 18 (SUTURE) ×3 IMPLANT
SUT STRATAFIX 0 PDS 27 VIOLET (SUTURE) ×3
SUT STRATAFIX PDS+ 0 24IN (SUTURE) ×3 IMPLANT
SUT VIC AB 1 CT1 36 (SUTURE) ×3 IMPLANT
SUTURE STRATFX 0 PDS 27 VIOLET (SUTURE) ×1 IMPLANT
SYR CONTROL 10ML LL (SYRINGE) ×6 IMPLANT
TIBIA STEM 5 DEG SZ E L KNEE (Knees) ×3 IMPLANT
TRAY FOLEY MTR SLVR 16FR STAT (SET/KITS/TRAYS/PACK) ×3 IMPLANT
WATER STERILE IRR 1000ML POUR (IV SOLUTION) ×6 IMPLANT
WRAP KNEE MAXI GEL POST OP (GAUZE/BANDAGES/DRESSINGS) ×3 IMPLANT
YANKAUER SUCT BULB TIP 10FT TU (MISCELLANEOUS) ×3 IMPLANT

## 2020-04-09 NOTE — Progress Notes (Signed)
Orthopedic Tech Progress Note Patient Details:  Christina Garner 1951-07-04 [id number]  CPM Left Knee CPM Left Knee: Off Left Knee Flexion (Degrees): 90 Left Knee Extension (Degrees): 0 Additional Comments: Trapeze bar and  foot roll  Post Interventions Patient Tolerated: Well Instructions Provided: Care of device  [place] Pink 04/09/2020, 1:05 PM

## 2020-04-09 NOTE — Op Note (Signed)
TOTAL KNEE REPLACEMENT OPERATIVE NOTE:  04/09/2020  12:53 PM  PATIENT:  Christina Garner  69 y.o. female  PRE-OPERATIVE DIAGNOSIS:  Osteoarthritis left knee  POST-OPERATIVE DIAGNOSIS:  Osteoarthritis left knee  PROCEDURE:  Procedure(s): TOTAL KNEE ARTHROPLASTY  SURGEON:  Surgeon(s): Vickey Huger, MD  PHYSICIAN ASSISTANT: Carlyon Shadow, PA-C   ANESTHESIA:   spinal  SPECIMEN: None  COUNTS:  Correct  TOURNIQUET:   Total Tourniquet Time Documented: Thigh (Left) - 37 minutes Total: Thigh (Left) - 37 minutes   DICTATION:  Indication for procedure:    The patient is a 69 y.o. female who has failed conservative treatment for Osteoarthritis left knee.  Informed consent was obtained prior to anesthesia. The risks versus benefits of the operation were explain and in a way the patient can, and did, understand.    Description of procedure:     The patient was taken to the operating room and placed under anesthesia.  The patient was positioned in the usual fashion taking care that all body parts were adequately padded and/or protected.  A tourniquet was applied and the leg prepped and draped in the usual sterile fashion.  The extremity was exsanguinated with the esmarch and tourniquet inflated to 350 mmHg.  Pre-operative range of motion was normal. A midline incision approximately 6-7 inches long was made with a #10 blade.  A new blade was used to make a parapatellar arthrotomy going 2-3 cm into the quadriceps tendon, over the patella, and alongside the medial aspect of the patellar tendon.  A synovectomy was then performed with the #10 blade and forceps. I then elevated the deep MCL off the medial tibial metaphysis subperiosteally around to the semimembranosus attachment.    I everted the patella and used calipers to measure patellar thickness.  I used the reamer to ream down to appropriate thickness to recreate the native thickness.  I then removed excess bone with the rongeur and  sagittal saw.  I used the appropriately sized template and drilled the three lug holes.  I then put the trial in place and measured the thickness with the calipers to ensure recreation of the native thickness.  The trial was then removed and the patella subluxed and the knee brought into flexion.  A homan retractor was place to retract and protect the patella and lateral structures.  A Z-retractor was place medially to protect the medial structures.  The extra-medullary alignment system was used to make cut the tibial articular surface perpendicular to the anamotic axis of the tibia and in 3 degrees of posterior slope.  The cut surface and alignment jig was removed.  I then used the intramedullary alignment guide to make a valgus cut on the distal femur.  I then marked out the epicondylar axis on the distal femur.    I then used the anterior referencing sizer and measured the femur to be a size 8.  The 4-In-1 cutting block was screwed into place in external rotation matching the posterior condylar angle, making our cuts perpendicular to the epicondylar axis.  Anterior, posterior and chamfer cuts were made with the sagittal saw.  The cutting block and cut pieces were removed.  A lamina spreader was placed in 90 degrees of flexion.  The ACL, PCL, menisci, and posterior condylar osteophytes were removed.  A 10 mm spacer blocked was found to offer good flexion and extension gap balance after minimal in degree releasing.   The scoop retractor was then placed and the femoral finishing block was pinned  in place.  The small sagittal saw was used as well as the lug drill to finish the femur.  The block and cut surfaces were removed and the medullary canal hole filled with autograft bone from the cut pieces.  The tibia was delivered forward in deep flexion and external rotation.  A size E tray was selected and pinned into place centered on the medial 1/3 of the tibial tubercle.  The reamer and keel was used to prepare  the tibia through the tray.    I then trialed with the size 8 femur, size E tibia, a 10 mm insert and the 32 patella.  I had excellent flexion/extension gap balance, excellent patella tracking.  Flexion was full and beyond 120 degrees; extension was zero.  These components were chosen and the staff opened them to me on the back table while the knee was lavaged copiously and the cement mixed.  The soft tissue was infiltrated with 60cc of exparel 1.3% through a 21 gauge needle.  I cemented in the components and removed all excess cement.  The polyethylene tibial component was snapped into place and the knee placed in extension while cement was hardening.  The capsule was infilltrated with a 60cc exparel/marcaine/saline mixture.   Once the cement was hard, the tourniquet was let down.  Hemostasis was obtained.  The arthrotomy was closed using a #1 stratofix running suture.  The deep soft tissues were closed with #0 vicryls and the subcuticular layer closed with #2-0 vicryl.  The skin was reapproximated and closed with 3.0 Monocryl.  The wound was covered with steristrips, aquacel dressing, and a TED stocking.   The patient was then awakened, extubated, and taken to the recovery room in stable condition.  BLOOD LOSS:  585ID COMPLICATIONS:  None.  PLAN OF CARE: Admit for overnight observation  PATIENT DISPOSITION:  PACU - hemodynamically stable.   Please fax a copy of this op note to my office at [phone] (please only include page 1 and 2 of the Case Information op note)

## 2020-04-09 NOTE — Transfer of Care (Signed)
Immediate Anesthesia Transfer of Care Note  Patient: Ninamarie B Teo  Procedure(s) Performed: TOTAL KNEE ARTHROPLASTY (Left Knee)  Patient Location: PACU  Anesthesia Type:Spinal  Level of Consciousness: awake and alert   Airway & Oxygen Therapy: Patient Spontanous Breathing and Patient connected to face mask oxygen  Post-op Assessment: Report given to RN and Post -op Vital signs reviewed and stable  Post vital signs: Reviewed and stable  Last Vitals:  Vitals Value Taken Time  BP 93/57 04/09/20 0856  Temp    Pulse 70 04/09/20 0857  Resp 16 04/09/20 0858  SpO2 97 % 04/09/20 0857  Vitals shown include unvalidated device data.  Last Pain:  Vitals:   04/09/20 0626  TempSrc:   PainSc: 0-No pain      Patients Stated Pain Goal: 3 (58/30/94 0768)  Complications: No complications documented.

## 2020-04-09 NOTE — Progress Notes (Signed)
Orthopedic Tech Progress Note Patient Details:  Christina Garner 12-Sep-1951 [id number]  CPM Left Knee CPM Left Knee: On Left Knee Flexion (Degrees): 90 Left Knee Extension (Degrees): 0 Additional Comments: Trapeze bar and  foot roll  Post Interventions Patient Tolerated: Well Instructions Provided: Care of device  [place] Pink 04/09/2020, 9:35 AM

## 2020-04-09 NOTE — H&P (Signed)
Christina Garner MRN:  [id number] DOB/SEX:  [birth year]/female  CHIEF COMPLAINT:  Painful left Knee  HISTORY: Patient is a 69 y.o. female presented with a history of pain in the left knee. Onset of symptoms was gradual starting a few years ago with gradually worsening course since that time. Patient has been treated conservatively with over-the-counter NSAIDs and activity modification. Patient currently rates pain in the knee at 10 out of 10 with activity. There is pain at night.  PAST MEDICAL HISTORY: Patient Active Problem List   Diagnosis Date Noted  . Chronic cough 01/14/2016  . LPRD (laryngopharyngeal reflux disease) 01/14/2016   Past Medical History:  Diagnosis Date  . Acid reflux   . Adult hypothyroidism   . Anxiety   . Arthritis   . Asthma   . Barrett's esophagus   . Cancer ([place])    skin cancer on leg and back  . Chronic kidney disease   . GAD (generalized anxiety disorder)   . GERD (gastroesophageal reflux disease)   . Headache   . High blood pressure   . Hypothyroidism   . Pneumonia   . Pre-diabetes   . Tremor    Past Surgical History:  Procedure Laterality Date  . CHOLECYSTECTOMY  1998  . COLONOSCOPY    . ESOPHAGOGASTRODUODENOSCOPY ENDOSCOPY    . TONSILLECTOMY       MEDICATIONS:   Medications Prior to Admission  Medication Sig Dispense Refill Last Dose  . ALPRAZolam (XANAX) 1 MG tablet Take 1 mg by mouth at bedtime.    04/08/2020 at Unknown time  . atenolol (TENORMIN) 50 MG tablet Take 50 mg by mouth 2 (two) times daily.    04/08/2020 at 2330  . famotidine (PEPCID) 40 MG tablet Take 40 mg by mouth at bedtime.   04/08/2020 at Unknown time  . hydrochlorothiazide (HYDRODIURIL) 50 MG tablet Take 50-100 mg by mouth daily. Swelling in feet   04/08/2020 at Unknown time  . levothyroxine (SYNTHROID) 125 MCG tablet Take 125 mcg by mouth daily before breakfast.    04/09/2020 at 0400  . losartan (COZAAR) 100 MG tablet Take 100 mg by mouth daily.   04/08/2020 at Unknown time  .  Multiple Vitamins-Minerals (MULTIVITAMIN WITH MINERALS) tablet Take 1 tablet by mouth daily.   04/08/2020 at Unknown time  . omeprazole (PRILOSEC) 40 MG capsule Take 1 capsule (40 mg total) by mouth 2 (two) times daily. 30MINS BEFORE BREAKFAST AND DINNER (Patient taking differently: Take 40 mg by mouth daily. 30MINS BEFORE BREAKFAST) 180 capsule 2 04/09/2020 at 0400  . venlafaxine XR (EFFEXOR-XR) 150 MG 24 hr capsule Take 150 mg by mouth daily.   04/08/2020 at Unknown time  . cyanocobalamin (,VITAMIN B-12,) 1000 MCG/ML injection Inject 1,000 mcg into the muscle every 30 (thirty) days.   More than a month at Unknown time  . ibuprofen (ADVIL) 800 MG tablet Take 800 mg by mouth every 8 (eight) hours as needed for moderate pain.   More than a month at Unknown time  . SUMAtriptan (IMITREX) 100 MG tablet Take 100 mg by mouth every 2 (two) hours as needed for migraine. May repeat in 2 hours if headache persists or recurs.   More than a month at Unknown time  . triamcinolone cream (KENALOG) 0.1 % Apply 1 application topically daily as needed (irritation).    More than a month at Unknown time    ALLERGIES:   Allergies  Allergen Reactions  . Allopurinol Other (See Comments)    Kidney Failure  .  Gabapentin Nausea And Vomiting  . Metformin And Related Other (See Comments)    Kidney Problems.  . Penicillins     rash  . Primidone Other (See Comments)    Made pt feel unlike herself     REVIEW OF SYSTEMS:  A comprehensive review of systems was negative except for: Musculoskeletal: positive for arthralgias and bone pain   FAMILY HISTORY:   Family History  Problem Relation Age of Onset  . Asthma Mother   . Diabetes Mother   . Atrial fibrillation Mother   . Heart Problems Mother   . Lung cancer Father   . Rheum arthritis Father   . Parkinson's disease Father   . Heart Problems Father   . High Cholesterol Sister   . Thyroid cancer Sister   . Heart attack Brother   . High Cholesterol Brother   .  Migraines Brother   . High Cholesterol Sister   . Migraines Sister     SOCIAL HISTORY:   Social History   Tobacco Use  . Smoking status: Former Smoker    Packs/day: 0.50    Years: 3.00    Pack years: 1.50    Types: Cigarettes    Start date: 10/21/1967    Quit date: 10/20/1969    Years since quitting: 50.5  . Smokeless tobacco: Never Used  Substance Use Topics  . Alcohol use: Never    Alcohol/week: 0.0 standard drinks     EXAMINATION:  Vital signs in last 24 hours: Temp:  [98.6 F (37 C)] 98.6 F (37 C) (06/21 0622) Pulse Rate:  [63] 63 (06/21 0622) Resp:  [16] 16 (06/21 0622) BP: (115)/(70) 115/70 (06/21 0622) SpO2:  [97 %] 97 % (06/21 0622) Weight:  [99.5 kg] 99.5 kg (06/21 0626)  BP 115/70   Pulse 63   Temp 98.6 F (37 C) (Oral)   Resp 16   Ht 5\' 6"  (1.676 m)   Wt 99.5 kg   SpO2 97%   BMI 35.41 kg/m   General Appearance:    Alert, cooperative, no distress, appears stated age  Head:    Normocephalic, without obvious abnormality, atraumatic  Eyes:    PERRL, conjunctiva/corneas clear, EOM's intact, fundi    benign, both eyes  Ears:    Normal TM's and external ear canals, both ears  Nose:   Nares normal, septum midline, mucosa normal, no drainage    or sinus tenderness  Throat:   Lips, mucosa, and tongue normal; teeth and gums normal  Neck:   Supple, symmetrical, trachea midline, no adenopathy;    thyroid:  no enlargement/tenderness/nodules; no carotid   bruit or JVD  Back:     Symmetric, no curvature, ROM normal, no CVA tenderness  Lungs:     Clear to auscultation bilaterally, respirations unlabored  Chest Wall:    No tenderness or deformity   Heart:    Regular rate and rhythm, S1 and S2 normal, no murmur, rub   or gallop  Breast Exam:    No tenderness, masses, or nipple abnormality  Abdomen:     Soft, non-tender, bowel sounds active all four quadrants,    no masses, no organomegaly  Genitalia:    Normal female without lesion, discharge or tenderness   Rectal:    Normal tone,  no masses or tenderness;   guaiac negative stool  Extremities:   Extremities normal, atraumatic, no cyanosis or edema  Pulses:   2+ and symmetric all extremities  Skin:   Skin color, texture, turgor  normal, no rashes or lesions  Lymph nodes:   Cervical, supraclavicular, and axillary nodes normal  Neurologic:   CNII-XII intact, normal strength, sensation and reflexes    throughout    Musculoskeletal:  ROM 0-120, Ligaments intact,  Imaging Review Plain radiographs demonstrate severe degenerative joint disease of the left knee. The overall alignment is neutral. The bone quality appears to be good for age and reported activity level.  Assessment/Plan: Primary osteoarthritis, left knee   The patient history, physical examination and imaging studies are consistent with advanced degenerative joint disease of the left knee. The patient has failed conservative treatment.  The clearance notes were reviewed.  After discussion with the patient it was felt that Total Knee Replacement was indicated. The procedure,  risks, and benefits of total knee arthroplasty were presented and reviewed. The risks including but not limited to aseptic loosening, infection, blood clots, vascular injury, stiffness, patella tracking problems complications among others were discussed. The patient acknowledged the explanation, agreed to proceed with the plan.  Preoperative templating of the joint replacement has been completed, documented, and submitted to the Operating Room personnel in order to optimize intra-operative equipment management.    Patient's anticipated LOS is less than 2 midnights, meeting these requirements: - Lives within 1 hour of care - Has a competent adult at home to recover with post-op recover - NO history of  - Chronic pain requiring opiods  - Diabetes  - Coronary Artery Disease  - Heart failure  - Heart attack  - Stroke  - DVT/VTE  - Cardiac arrhythmia  -  Respiratory Failure/COPD  - Renal failure  - Anemia  - Advanced Liver disease       Donia Ast 04/09/2020, 6:50 AM

## 2020-04-09 NOTE — TOC Progression Note (Signed)
Transition of Care [facility]) - Progression Note    Patient Details  Name: Christina Garner MRN: [id number] Date of Birth: 1951-02-21  Transition of Care [facility]) CM/SW [place], [place] Phone Number: 04/09/2020, 2:11 PM  Clinical Narrative:    Therapy Plan: HHPT [facility]  CSW confirm the patient has a RW and 3 in 1.      Barriers to Discharge: Continued Medical Work up  Expected Discharge Plan and Services                           DME Arranged: N/A DME Agency: NA       HH Arranged: PT [place] Agency: [facility] (formerly [employer]) Date [place]: 04/09/20 Time [place]: [place] Representative spoke with at [place]: [place] ([place]) Interventions    Readmission Risk Interventions No flowsheet data found.

## 2020-04-09 NOTE — Anesthesia Postprocedure Evaluation (Signed)
Anesthesia Post Note  Patient: Alexsus B Barra  Procedure(s) Performed: TOTAL KNEE ARTHROPLASTY (Left Knee)     Patient location during evaluation: PACU Anesthesia Type: Spinal Level of consciousness: awake and alert Pain management: pain level controlled Vital Signs Assessment: post-procedure vital signs reviewed and stable Respiratory status: spontaneous breathing and respiratory function stable Cardiovascular status: blood pressure returned to baseline and stable Postop Assessment: spinal receding and no apparent nausea or vomiting Anesthetic complications: no   No complications documented.  Last Vitals:  Vitals:   04/09/20 1057 04/09/20 1151  BP: (!) 114/54 114/63  Pulse: 78 74  Resp: 14 14  Temp: (!) 36.4 C 36.8 C  SpO2: 99% 100%    Last Pain:  Vitals:   04/09/20 1057  TempSrc: Oral  PainSc:                  Audry Pili

## 2020-04-09 NOTE — Evaluation (Signed)
Physical Therapy Evaluation Patient Details Name: Christina Garner MRN: [id number] DOB: 11/02/1950 Today's Date: 04/09/2020   History of Present Illness  s/p L TKA  Clinical Impression  Pt is s/p TKA resulting in the deficits listed below (see PT Problem List).  Amb 10'  With RW and min assist. Limited by pain. Anticipate steady progres, has 2 steps to enter home. Pt planning for d/c tomorrow.   Pt will benefit from skilled PT to increase their independence and safety with mobility to allow discharge to the venue listed below.      Follow Up Recommendations Follow surgeon's recommendation for DC plan and follow-up therapies    Equipment Recommendations  None recommended by PT    Recommendations for Other Services       Precautions / Restrictions Precautions Precautions: Fall;Knee Restrictions Weight Bearing Restrictions: No Other Position/Activity Restrictions: WBAT      Mobility  Bed Mobility Overal bed mobility: Needs Assistance Bed Mobility: Supine to Sit     Supine to sit: Min assist     General bed mobility comments: assist with LLE, incr time  Transfers Overall transfer level: Needs assistance Equipment used: Rolling walker (2 wheeled) Transfers: Sit to/from Stand Sit to Stand: Min assist         General transfer comment: cues for hand placement, assist to rise and transition to RW  Ambulation/Gait Ambulation/Gait assistance: Min assist Gait Distance (Feet): 10 Feet Assistive device: Rolling walker (2 wheeled) Gait Pattern/deviations: Step-to pattern;Decreased stance time - right     General Gait Details: cues for sequence, RW position  Stairs            Wheelchair Mobility    Modified Rankin (Stroke Patients Only)       Balance                                             Pertinent Vitals/Pain Pain Assessment: 0-10 Pain Score: 5  Pain Location: L knee Pain Descriptors / Indicators: Aching;Grimacing;Sore Pain  Intervention(s): Limited activity within patient's tolerance;Monitored during session;Premedicated before session;Repositioned    Home Living Family/patient expects to be discharged to:: Private residence Living Arrangements: Non-relatives/Friends   Type of Home: House Home Access: Stairs to enter   Technical brewer of Steps: 2 Home Layout: One level Home Equipment: Environmental consultant - 2 wheels;Bedside commode Additional Comments: 2 BSC, will use one in shower    Prior Function Level of Independence: Independent               Hand Dominance        Extremity/Trunk Assessment   Upper Extremity Assessment Upper Extremity Assessment: Overall WFL for tasks assessed    Lower Extremity Assessment Lower Extremity Assessment: LLE deficits/detail LLE Deficits / Details: ankle WFL; knee extension and hip flexion 2+/5; knee flexion ~8 to 70 degrees LLE: Unable to fully assess due to pain       Communication   Communication: No difficulties  Cognition Arousal/Alertness: Awake/alert Behavior During Therapy: WFL for tasks assessed/performed Overall Cognitive Status: Within Functional Limits for tasks assessed                                        General Comments      Exercises Total Joint Exercises Ankle Circles/Pumps: AROM;Both;5 reps [employer]  Sets: Both;AROM;5 reps;Limitations Quad Sets Limitations: pain   Assessment/Plan    PT Assessment Patient needs continued PT services  PT Problem List Decreased strength;Decreased range of motion;Decreased activity tolerance;Decreased mobility;Decreased knowledge of use of DME;Pain       PT Treatment Interventions DME instruction;Gait training;Functional mobility training;Therapeutic activities;Patient/family education;Stair training;Therapeutic exercise    PT Goals (Current goals can be found in the Care Plan section)  Acute Rehab PT Goals Patient Stated Goal: have less knee pain PT Goal Formulation: With  patient Time For Goal Achievement: 04/16/20 Potential to Achieve Goals: Good    Frequency 7X/week   Barriers to discharge        Co-evaluation               AM-PAC PT "6 Clicks" Mobility  Outcome Measure Help needed turning from your back to your side while in a flat bed without using bedrails?: A Little Help needed moving from lying on your back to sitting on the side of a flat bed without using bedrails?: A Little Help needed moving to and from a bed to a chair (including a wheelchair)?: A Little Help needed standing up from a chair using your arms (e.g., wheelchair or bedside chair)?: A Little Help needed to walk in hospital room?: A Little Help needed climbing 3-5 steps with a railing? : A Lot 6 Click Score: 17    End of Session Equipment Utilized During Treatment: Gait belt Activity Tolerance: Patient tolerated treatment well Patient left: in chair;with call bell/phone within reach Nurse Communication: Mobility status PT Visit Diagnosis: Difficulty in walking, not elsewhere classified (R26.2)    Time: 1470-9295 PT Time Calculation (min) (ACUTE ONLY): 20 min   Charges:   PT Evaluation $PT Eval Low Complexity: [place], PT  Acute Rehab Dept ([place]) [phone] Pager [phone]  04/09/2020   [facility] 04/09/2020, 1:30 PM

## 2020-04-09 NOTE — Anesthesia Procedure Notes (Signed)
Anesthesia Regional Block: Adductor canal block   Pre-Anesthetic Checklist: ,, timeout performed, Correct Patient, Correct Site, Correct Laterality, Correct Procedure, Correct Position, site marked, Risks and benefits discussed,  Surgical consent,  Pre-op evaluation,  At surgeon's request and post-op pain management  Laterality: Left  Prep: chloraprep       Needles:  Injection technique: Single-shot  Needle Type: Echogenic Needle     Needle Length: 10cm  Needle Gauge: 21     Additional Needles:   Narrative:  Start time: 04/09/2020 7:03 AM End time: 04/09/2020 7:06 AM Injection made incrementally with aspirations every 5 mL.  Performed by: Personally  Anesthesiologist: Audry Pili, MD  Additional Notes: No pain on injection. No increased resistance to injection. Injection made in 5cc increments. Good needle visualization. Patient tolerated the procedure well.

## 2020-04-09 NOTE — Anesthesia Procedure Notes (Signed)
Spinal  Patient location during procedure: OR Start time: 04/09/2020 7:26 AM End time: 04/09/2020 7:30 AM Staffing Performed: anesthesiologist  Anesthesiologist: Audry Pili, MD Preanesthetic Checklist Completed: patient identified, IV checked, risks and benefits discussed, surgical consent, monitors and equipment checked, pre-op evaluation and timeout performed Spinal Block Patient position: sitting Prep: DuraPrep Patient monitoring: heart rate, cardiac monitor, continuous pulse ox and blood pressure Approach: midline Location: L3-4 Injection technique: single-shot Needle Needle type: Pencan  Needle gauge: 24 G Additional Notes Consent was obtained prior to the procedure with all questions answered and concerns addressed. Risks including, but not limited to, bleeding, infection, nerve damage, paralysis, failed block, inadequate analgesia, allergic reaction, high spinal, itching, and headache were discussed and the patient wished to proceed. Functioning IV was confirmed and monitors were applied. Sterile prep and drape, including hand hygiene, mask, and sterile gloves were used. The patient was positioned and the spine was prepped. The skin was anesthetized with lidocaine. Free flow of clear CSF was obtained prior to injecting local anesthetic into the CSF. The spinal needle aspirated freely following injection. The needle was carefully withdrawn. The patient tolerated the procedure well.   Renold Don, MD

## 2020-04-09 NOTE — Anesthesia Procedure Notes (Signed)
Date/Time: 04/09/2020 7:35 AM Performed by: Sharlette Dense, CRNA Oxygen Delivery Method: Simple face mask

## 2020-04-10 ENCOUNTER — Encounter (HOSPITAL_COMMUNITY): Payer: Self-pay | Admitting: Orthopedic Surgery

## 2020-04-10 DIAGNOSIS — M1712 Unilateral primary osteoarthritis, left knee: Secondary | ICD-10-CM | POA: Diagnosis not present

## 2020-04-10 LAB — BASIC METABOLIC PANEL
Anion gap: 12 (ref 5–15)
BUN: 29 mg/dL — ABNORMAL HIGH (ref 8–23)
CO2: 21 mmol/L — ABNORMAL LOW (ref 22–32)
Calcium: 7.5 mg/dL — ABNORMAL LOW (ref 8.9–10.3)
Chloride: 104 mmol/L (ref 98–111)
Creatinine, Ser: 1.76 mg/dL — ABNORMAL HIGH (ref 0.44–1.00)
GFR calc Af Amer: 33 mL/min — ABNORMAL LOW (ref >60)
GFR calc non Af Amer: 29 mL/min — ABNORMAL LOW (ref >60)
Glucose, Bld: 128 mg/dL — ABNORMAL HIGH (ref 70–99)
Potassium: 3.5 mmol/L (ref 3.5–5.1)
Sodium: 137 mmol/L (ref 135–145)

## 2020-04-10 LAB — CBC
HCT: 31.4 % — ABNORMAL LOW (ref 36.0–46.0)
Hemoglobin: 10.8 g/dL — ABNORMAL LOW (ref 12.0–15.0)
MCH: 31.4 pg (ref 26.0–34.0)
MCHC: 34.4 g/dL (ref 30.0–36.0)
MCV: 91.3 fL (ref 80.0–100.0)
Platelets: 203 10*3/uL (ref 150–400)
RBC: 3.44 MIL/uL — ABNORMAL LOW (ref 3.87–5.11)
RDW: 12.5 % (ref 11.5–15.5)
WBC: 10.6 10*3/uL — ABNORMAL HIGH (ref 4.0–10.5)
nRBC: 0 % (ref 0.0–0.2)

## 2020-04-10 MED ORDER — OXYCODONE HCL 5 MG PO TABS: 5.0000 mg | ORAL_TABLET | Freq: Four times a day (QID) | ORAL | 0 refills | Status: DC | PRN

## 2020-04-10 MED ORDER — ASPIRIN 325 MG PO TBEC: 325.0000 mg | DELAYED_RELEASE_TABLET | Freq: Two times a day (BID) | ORAL | 0 refills | Status: DC

## 2020-04-10 MED ORDER — TIZANIDINE HCL 4 MG PO TABS: 4.0000 mg | ORAL_TABLET | Freq: Four times a day (QID) | ORAL | 0 refills | Status: DC | PRN

## 2020-04-10 NOTE — Progress Notes (Signed)
Physical Therapy Treatment Patient Details Name: Christina Garner MRN: [id number] DOB: Nov 02, 1950 Today's Date: 04/10/2020    History of Present Illness s/p L TKA    PT Comments    POD # 1 am session Assisted with amb Then returned to room to perform some TE's following HEP handout.  Instructed on proper tech, freq as well as use of ICE.     Follow Up Recommendations  Follow surgeons recommendation for DC plan and follow-up therapies     Equipment Recommendations  None recommended by PT    Recommendations for Other Services       Precautions / Restrictions Precautions Precautions: Fall;Knee Precaution Comments: instructed no pillow under knee Restrictions Other Position/Activity Restrictions: WBAT    Mobility  Bed Mobility               General bed mobility comments: OOB in recliner  Transfers Overall transfer level: Needs assistance Equipment used: Rolling walker (2 wheeled) Transfers: Sit to/from [employer] Transfers Sit to Stand: Supervision Stand pivot transfers: Supervision;Min guard       General transfer comment: cues for hand placement, assist to rise and transition to RW  Ambulation/Gait Ambulation/Gait assistance: Supervision;Min guard Gait Distance (Feet): 55 Feet Assistive device: Rolling walker (2 wheeled) Gait Pattern/deviations: Step-to pattern;Decreased stance time - right     General Gait Details: cues for sequence, RW position   Stairs             Wheelchair Mobility    Modified Rankin (Stroke Patients Only)       Balance                                            Cognition Arousal/Alertness: Awake/alert Behavior During Therapy: WFL for tasks assessed/performed Overall Cognitive Status: Within Functional Limits for tasks assessed                                 General Comments: AxO x 4 motivated      Exercises   Total Knee Replacement TE's following HEP handout 10 reps  B LE ankle pumps 05 reps towel squeezes 05 reps knee presses 05 reps heel slides  05 reps SAQ's 05 reps SLR's 05 reps ABD Educated on use of gait belt to assist with TE's Followed by ICE     General Comments        Pertinent Vitals/Pain Pain Assessment: 0-10 Pain Score: 3  Pain Location: L knee Pain Descriptors / Indicators: Aching;Grimacing;Sore;Operative site guarding Pain Intervention(s): Monitored during session;Premedicated before session;Repositioned;Ice applied    Home Living                      Prior Function            PT Goals (current goals can now be found in the care plan section) Progress towards PT goals: Progressing toward goals    Frequency    7X/week      PT Plan Current plan remains appropriate    Co-evaluation              AM-PAC PT "6 Clicks" Mobility   Outcome Measure  Help needed turning from your back to your side while in a flat bed without using bedrails?: A Little Help needed moving from lying on your back to  sitting on the side of a flat bed without using bedrails?: A Little Help needed moving to and from a bed to a chair (including a wheelchair)?: A Little Help needed standing up from a chair using your arms (e.g., wheelchair or bedside chair)?: A Little Help needed to walk in hospital room?: A Little Help needed climbing 3-5 steps with a railing? : A Lot 6 Click Score: 17    End of Session Equipment Utilized During Treatment: Gait belt Activity Tolerance: Patient tolerated treatment well Patient left: in chair;with call bell/phone within reach Nurse Communication: Mobility status PT Visit Diagnosis: Difficulty in walking, not elsewhere classified (R26.2)     Time: 1287-8676 PT Time Calculation (min) (ACUTE ONLY): 25 min  Charges:  $Gait Training: 8-22 mins $Therapeutic Exercise: 8-22 mins                     Rica Koyanagi  PTA Acute  Rehabilitation Services Pager      [phone] Office       [phone]

## 2020-04-10 NOTE — Progress Notes (Signed)
Physical Therapy Treatment Patient Details Name: Christina Garner MRN: [id number] DOB: [birth year] Today's Date: 04/10/2020    History of Present Illness s/p L TKA    PT Comments    POD # 1 pm session Assisted with amb in hallway, practiced stairs and completed HEP. Addressed all mobility questions, discussed appropriate activity, educated on use of ICE.  Pt ready for D/C to home.   Follow Up Recommendations  Follow surgeon's recommendation for DC plan and follow-up therapies     Equipment Recommendations  None recommended by PT    Recommendations for Other Services       Precautions / Restrictions Precautions Precautions: Fall;Knee Precaution Comments: instructed no pillow under knee Restrictions Other Position/Activity Restrictions: WBAT    Mobility  Bed Mobility               General bed mobility comments: OOB in recliner  Transfers Overall transfer level: Needs assistance Equipment used: Rolling walker (2 wheeled) Transfers: Sit to/from [facility] Sit to Stand: Supervision Stand pivot transfers: Supervision;Min guard       General transfer comment: cues for hand placement, assist to rise and transition to RW  Ambulation/Gait Ambulation/Gait assistance: Supervision;Min guard Gait Distance (Feet): 55 Feet Assistive device: Rolling walker (2 wheeled) Gait Pattern/deviations: Step-to pattern;Decreased stance time - right     General Gait Details: cues for sequence, RW position   Stairs Stairs: Yes Stairs assistance: Supervision;Min guard Stair Management: Two rails;Step to pattern;Forwards Number of Stairs: 2 General stair comments: 25% VC's on proper sequencing.  Practived with friens "care giver"   Wheelchair Mobility    Modified Rankin (Stroke Patients Only)       Balance                                            Cognition Arousal/Alertness: Awake/alert Behavior During Therapy: WFL for tasks  assessed/performed Overall Cognitive Status: Within Functional Limits for tasks assessed                                 General Comments: AxO x 4 motivated      Exercises  5 reps all seated TE's followinh HEP handout    General Comments        Pertinent Vitals/Pain Pain Assessment: 0-10 Pain Score: 3  Pain Location: L knee Pain Descriptors / Indicators: Aching;Grimacing;Sore;Operative site guarding Pain Intervention(s): Monitored during session;Premedicated before session;Repositioned;Ice applied    Home Living                      Prior Function            PT Goals (current goals can now be found in the care plan section) Progress towards PT goals: Progressing toward goals    Frequency    7X/week      PT Plan Current plan remains appropriate    Co-evaluation              AM-PAC PT "6 Clicks" Mobility   Outcome Measure  Help needed turning from your back to your side while in a flat bed without using bedrails?: A Little Help needed moving from lying on your back to sitting on the side of a flat bed without using bedrails?: A Little Help needed moving to and from a bed  to a chair (including a wheelchair)?: A Little Help needed standing up from a chair using your arms (e.g., wheelchair or bedside chair)?: A Little Help needed to walk in hospital room?: A Little Help needed climbing 3-5 steps with a railing? : A Lot 6 Click Score: 17    End of Session Equipment Utilized During Treatment: Gait belt Activity Tolerance: Patient tolerated treatment well Patient left: in chair;with call bell/phone within reach Nurse Communication: Mobility status PT Visit Diagnosis: Difficulty in walking, not elsewhere classified (R26.2)     Time: 1245-1310 PT Time Calculation (min) (ACUTE ONLY): 25 min  Charges:  $Gait Training: 8-22 mins $Therapeutic Activity: 8-22 mins                     Rica Koyanagi  PTA Acute  Rehabilitation  Services Pager      [phone] Office      [phone]

## 2020-04-10 NOTE — Progress Notes (Signed)
  RN reviewed discharge instructions with patient and family.   Paperwork given and prescriptions sent.    All questions answered.   NT rolled patient down with all belongings to family car.      [place], Therapist, sports

## 2020-04-10 NOTE — Progress Notes (Signed)
SPORTS MEDICINE AND JOINT REPLACEMENT  Lara Mulch, MD    Carlyon Shadow, PA-C [place], [place], [place]  [address]                             [phone]   PROGRESS NOTE  Subjective:  negative for Chest Pain  negative for Shortness of Breath  negative for Nausea/Vomiting   negative for Calf Pain  negative for Bowel Movement   Tolerating Diet: yes         Patient reports pain as 4 on 0-10 scale.    Objective: Vital signs in last 24 hours:    Patient Vitals for the past 24 hrs:  BP Temp Temp src Pulse Resp SpO2  04/10/20 0530 106/61 98.4 F (36.9 C) Oral 78 18 100 %  04/10/20 0216 100/60 98.6 F (37 C) Oral 78 18 100 %  04/09/20 2220 106/67 (!) 97.5 F (36.4 C) Oral 72 18 96 %  04/09/20 1818 111/61 -- -- 74 16 98 %  04/09/20 1333 117/65 98.6 F (37 C) -- 74 17 97 %  04/09/20 1250 114/67 98.6 F (37 C) Oral 77 17 100 %  04/09/20 1151 114/63 98.3 F (36.8 C) -- 74 14 100 %  04/09/20 1057 (!) 114/54 (!) 97.5 F (36.4 C) Oral 78 14 99 %  04/09/20 1030 96/64 -- -- 72 15 97 %  04/09/20 1000 110/73 -- -- 70 15 100 %  04/09/20 0945 109/68 -- -- 66 14 95 %  04/09/20 0930 (!) 89/60 -- -- 70 15 99 %  04/09/20 0915 101/62 -- -- 69 12 100 %  04/09/20 0900 100/60 -- -- 71 13 91 %  04/09/20 0856 (!) 93/57 (!) 97.5 F (36.4 C) -- 70 13 97 %    @flow {1959:LAST@   Intake/Output from previous day:   06/21 0701 - 06/22 0700 In: 4577.3 [P.O.:1320; I.V.:2972.9] Out: 1325 [Urine:1300]   Intake/Output this shift:   No intake/output data recorded.   Intake/Output      06/21 0701 - 06/22 0700 06/22 0701 - 06/23 0700   P.O. 1320    I.V. (mL/kg) 2972.9 (29.9)    IV Piggyback 284.4    Total Intake(mL/kg) 4577.3 (46)    Urine (mL/kg/hr) 1300 (0.5)    Stool 0    Blood 25    Total Output 1325    Net +3252.3            LABORATORY DATA: Recent Labs    04/10/20 0242  WBC 10.6*  HGB 10.8*  HCT 31.4*  PLT 203   Recent Labs    04/10/20 0242  NA 137  K 3.5   CL 104  CO2 21*  BUN 29*  CREATININE 1.76*  GLUCOSE 128*  CALCIUM 7.5*   No results found for: INR, PROTIME  Examination:  General appearance: alert, cooperative and no distress Extremities: extremities normal, atraumatic, no cyanosis or edema  Wound Exam: clean, dry, intact   Drainage:  None: wound tissue dry  Motor Exam: Quadriceps and Hamstrings Intact  Sensory Exam: Superficial Peroneal, Deep Peroneal and Tibial normal   Assessment:    1 Day Post-Op  Procedure(s) (LRB): TOTAL KNEE ARTHROPLASTY (Left)  ADDITIONAL DIAGNOSIS:  Active Problems:   S/P total knee replacement     Plan: Physical Therapy as ordered Weight Bearing as Tolerated (WBAT)  DVT Prophylaxis:  Aspirin  DISCHARGE PLAN: Home   Patient doing well, expect D/C  home today      Patient's anticipated LOS is less than 2 midnights, meeting these requirements: - Lives within 1 hour of care - Has a competent adult at home to recover with post-op recover - NO history of  - Chronic pain requiring opiods  - Diabetes  - Coronary Artery Disease  - Heart failure  - Heart attack  - Stroke  - DVT/VTE  - Cardiac arrhythmia  - Respiratory Failure/COPD  - Renal failure  - Anemia  - Advanced Liver disease        Donia Ast 04/10/2020, 7:19 AM

## 2020-04-10 NOTE — Plan of Care (Signed)

## 2020-04-10 NOTE — Discharge Summary (Signed)
SPORTS MEDICINE & JOINT REPLACEMENT   Christina Mulch, MD   Carlyon Shadow, PA-C [place], [place], [place]  [address]                             [phone]  PATIENT ID: [name]        MRN:  [id number]          DOB/AGE: 70/24/1952 / 70 y.o.    DISCHARGE SUMMARY  ADMISSION DATE:    04/09/2020 DISCHARGE DATE:   04/10/2020   ADMISSION DIAGNOSIS: S/P total knee replacement [Z96.659]    DISCHARGE DIAGNOSIS:  Osteoarthritis left knee    ADDITIONAL DIAGNOSIS: Active Problems:   S/P total knee replacement  Past Medical History:  Diagnosis Date  . Acid reflux   . Adult hypothyroidism   . Anxiety   . Arthritis   . Asthma   . Barrett's esophagus   . Cancer ([place])    skin cancer on leg and back  . Chronic kidney disease   . GAD (generalized anxiety disorder)   . GERD (gastroesophageal reflux disease)   . Headache   . High blood pressure   . Hypothyroidism   . Pneumonia   . Pre-diabetes   . Tremor     PROCEDURE: Procedure(s): TOTAL KNEE ARTHROPLASTY on 04/09/2020  CONSULTS:    HISTORY:  See H&P in chart  HOSPITAL COURSE:  Christina Garner is a 69 y.o. admitted on 04/09/2020 and found to have a diagnosis of Osteoarthritis left knee.  After appropriate laboratory studies were obtained  they were taken to the operating room on 04/09/2020 and underwent Procedure(s): TOTAL KNEE ARTHROPLASTY.   They were given perioperative antibiotics:  Anti-infectives (From admission, onward)   Start     Dose/Rate Route Frequency Ordered Stop   04/09/20 1330  ceFAZolin (ANCEF) IVPB 2g/100 mL premix        2 g 200 mL/hr over 30 Minutes Intravenous Every 6 hours 04/09/20 1059 04/09/20 2027   04/09/20 0615  ceFAZolin (ANCEF) IVPB 2g/100 mL premix        2 g 200 mL/hr over 30 Minutes Intravenous On call to O.R. 04/09/20 9622 04/09/20 0736    .  Patient given tranexamic acid IV or topical and exparel intra-operatively.  Tolerated the procedure well.    POD# 1: Vital signs were  stable.  Patient denied Chest pain, shortness of breath, or calf pain.  Patient was started on Aspirin twice daily at 8am.  Consults to PT, OT, and care management were made.  The patient was weight bearing as tolerated.  CPM was placed on the operative leg 0-90 degrees for 6-8 hours a day. When out of the CPM, patient was placed in the foam block to achieve full extension. Incentive spirometry was taught.  Dressing was changed.       POD #2, Continued  PT for ambulation and exercise program.  IV saline locked.  O2 discontinued.    The remainder of the hospital course was dedicated to ambulation and strengthening.   The patient was discharged on 1 Day Post-Op in  Good condition.  Blood products given:none  DIAGNOSTIC STUDIES: Recent vital signs:  Patient Vitals for the past 24 hrs:  BP Temp Temp src Pulse Resp SpO2  04/10/20 0530 106/61 98.4 F (36.9 C) Oral 78 18 100 %  04/10/20 0216 100/60 98.6 F (37 C) Oral 78 18 100 %  04/09/20 2220 106/67 Marland Kitchen)  97.5 F (36.4 C) Oral 72 18 96 %  04/09/20 1818 111/61 -- -- 74 16 98 %  04/09/20 1333 117/65 98.6 F (37 C) -- 74 17 97 %  04/09/20 1250 114/67 98.6 F (37 C) Oral 77 17 100 %  04/09/20 1151 114/63 98.3 F (36.8 C) -- 74 14 100 %  04/09/20 1057 (!) 114/54 (!) 97.5 F (36.4 C) Oral 78 14 99 %  04/09/20 1030 96/64 -- -- 72 15 97 %  04/09/20 1000 110/73 -- -- 70 15 100 %  04/09/20 0945 109/68 -- -- 66 14 95 %  04/09/20 0930 (!) 89/60 -- -- 70 15 99 %  04/09/20 0915 101/62 -- -- 69 12 100 %  04/09/20 0900 100/60 -- -- 71 13 91 %  04/09/20 0856 (!) 93/57 (!) 97.5 F (36.4 C) -- 70 13 97 %       Recent laboratory studies: Recent Labs    04/10/20 0242  WBC 10.6*  HGB 10.8*  HCT 31.4*  PLT 203   Recent Labs    04/10/20 0242  NA 137  K 3.5  CL 104  CO2 21*  BUN 29*  CREATININE 1.76*  GLUCOSE 128*  CALCIUM 7.5*   No results found for: INR, PROTIME   Recent Radiographic Studies :  No results found.  DISCHARGE  INSTRUCTIONS:   DISCHARGE MEDICATIONS:     FOLLOW UP VISIT:    DISPOSITION: HOME VS. SNF  CONDITION:  Good   Donia Ast 04/10/2020, 7:20 AM

## 2020-04-11 DIAGNOSIS — Z87891 Personal history of nicotine dependence: Secondary | ICD-10-CM | POA: Diagnosis not present

## 2020-04-11 DIAGNOSIS — E039 Hypothyroidism, unspecified: Secondary | ICD-10-CM | POA: Diagnosis not present

## 2020-04-11 DIAGNOSIS — Z7982 Long term (current) use of aspirin: Secondary | ICD-10-CM | POA: Diagnosis not present

## 2020-04-11 DIAGNOSIS — N189 Chronic kidney disease, unspecified: Secondary | ICD-10-CM | POA: Diagnosis not present

## 2020-04-11 DIAGNOSIS — Z96652 Presence of left artificial knee joint: Secondary | ICD-10-CM | POA: Diagnosis not present

## 2020-04-11 DIAGNOSIS — E1122 Type 2 diabetes mellitus with diabetic chronic kidney disease: Secondary | ICD-10-CM | POA: Diagnosis not present

## 2020-04-11 DIAGNOSIS — K219 Gastro-esophageal reflux disease without esophagitis: Secondary | ICD-10-CM | POA: Diagnosis not present

## 2020-04-11 DIAGNOSIS — I129 Hypertensive chronic kidney disease with stage 1 through stage 4 chronic kidney disease, or unspecified chronic kidney disease: Secondary | ICD-10-CM | POA: Diagnosis not present

## 2020-04-11 DIAGNOSIS — Z8701 Personal history of pneumonia (recurrent): Secondary | ICD-10-CM | POA: Diagnosis not present

## 2020-04-11 DIAGNOSIS — Z471 Aftercare following joint replacement surgery: Secondary | ICD-10-CM | POA: Diagnosis not present

## 2020-04-11 DIAGNOSIS — J45909 Unspecified asthma, uncomplicated: Secondary | ICD-10-CM | POA: Diagnosis not present

## 2020-04-11 DIAGNOSIS — M1990 Unspecified osteoarthritis, unspecified site: Secondary | ICD-10-CM | POA: Diagnosis not present

## 2020-04-11 DIAGNOSIS — F411 Generalized anxiety disorder: Secondary | ICD-10-CM | POA: Diagnosis not present

## 2020-04-11 DIAGNOSIS — K2270 Barrett's esophagus without dysplasia: Secondary | ICD-10-CM | POA: Diagnosis not present

## 2020-04-11 DIAGNOSIS — Z85828 Personal history of other malignant neoplasm of skin: Secondary | ICD-10-CM | POA: Diagnosis not present

## 2020-04-12 DIAGNOSIS — E1122 Type 2 diabetes mellitus with diabetic chronic kidney disease: Secondary | ICD-10-CM | POA: Diagnosis not present

## 2020-04-12 DIAGNOSIS — E039 Hypothyroidism, unspecified: Secondary | ICD-10-CM | POA: Diagnosis not present

## 2020-04-12 DIAGNOSIS — I129 Hypertensive chronic kidney disease with stage 1 through stage 4 chronic kidney disease, or unspecified chronic kidney disease: Secondary | ICD-10-CM | POA: Diagnosis not present

## 2020-04-12 DIAGNOSIS — Z471 Aftercare following joint replacement surgery: Secondary | ICD-10-CM | POA: Diagnosis not present

## 2020-04-12 DIAGNOSIS — N189 Chronic kidney disease, unspecified: Secondary | ICD-10-CM | POA: Diagnosis not present

## 2020-04-12 DIAGNOSIS — F411 Generalized anxiety disorder: Secondary | ICD-10-CM | POA: Diagnosis not present

## 2020-04-13 DIAGNOSIS — F411 Generalized anxiety disorder: Secondary | ICD-10-CM | POA: Diagnosis not present

## 2020-04-13 DIAGNOSIS — E1122 Type 2 diabetes mellitus with diabetic chronic kidney disease: Secondary | ICD-10-CM | POA: Diagnosis not present

## 2020-04-13 DIAGNOSIS — I129 Hypertensive chronic kidney disease with stage 1 through stage 4 chronic kidney disease, or unspecified chronic kidney disease: Secondary | ICD-10-CM | POA: Diagnosis not present

## 2020-04-13 DIAGNOSIS — Z471 Aftercare following joint replacement surgery: Secondary | ICD-10-CM | POA: Diagnosis not present

## 2020-04-13 DIAGNOSIS — N189 Chronic kidney disease, unspecified: Secondary | ICD-10-CM | POA: Diagnosis not present

## 2020-04-13 DIAGNOSIS — E039 Hypothyroidism, unspecified: Secondary | ICD-10-CM | POA: Diagnosis not present

## 2020-04-16 DIAGNOSIS — F411 Generalized anxiety disorder: Secondary | ICD-10-CM | POA: Diagnosis not present

## 2020-04-16 DIAGNOSIS — I129 Hypertensive chronic kidney disease with stage 1 through stage 4 chronic kidney disease, or unspecified chronic kidney disease: Secondary | ICD-10-CM | POA: Diagnosis not present

## 2020-04-16 DIAGNOSIS — E039 Hypothyroidism, unspecified: Secondary | ICD-10-CM | POA: Diagnosis not present

## 2020-04-16 DIAGNOSIS — E1122 Type 2 diabetes mellitus with diabetic chronic kidney disease: Secondary | ICD-10-CM | POA: Diagnosis not present

## 2020-04-16 DIAGNOSIS — Z471 Aftercare following joint replacement surgery: Secondary | ICD-10-CM | POA: Diagnosis not present

## 2020-04-16 DIAGNOSIS — N189 Chronic kidney disease, unspecified: Secondary | ICD-10-CM | POA: Diagnosis not present

## 2020-04-18 DIAGNOSIS — E039 Hypothyroidism, unspecified: Secondary | ICD-10-CM | POA: Diagnosis not present

## 2020-04-18 DIAGNOSIS — E1122 Type 2 diabetes mellitus with diabetic chronic kidney disease: Secondary | ICD-10-CM | POA: Diagnosis not present

## 2020-04-18 DIAGNOSIS — Z471 Aftercare following joint replacement surgery: Secondary | ICD-10-CM | POA: Diagnosis not present

## 2020-04-18 DIAGNOSIS — F411 Generalized anxiety disorder: Secondary | ICD-10-CM | POA: Diagnosis not present

## 2020-04-18 DIAGNOSIS — N189 Chronic kidney disease, unspecified: Secondary | ICD-10-CM | POA: Diagnosis not present

## 2020-04-18 DIAGNOSIS — I129 Hypertensive chronic kidney disease with stage 1 through stage 4 chronic kidney disease, or unspecified chronic kidney disease: Secondary | ICD-10-CM | POA: Diagnosis not present

## 2020-04-19 DIAGNOSIS — M25662 Stiffness of left knee, not elsewhere classified: Secondary | ICD-10-CM | POA: Diagnosis not present

## 2020-04-19 DIAGNOSIS — M6281 Muscle weakness (generalized): Secondary | ICD-10-CM | POA: Diagnosis not present

## 2020-04-19 DIAGNOSIS — M25562 Pain in left knee: Secondary | ICD-10-CM | POA: Diagnosis not present

## 2020-04-24 DIAGNOSIS — M25562 Pain in left knee: Secondary | ICD-10-CM | POA: Diagnosis not present

## 2020-04-24 DIAGNOSIS — M6281 Muscle weakness (generalized): Secondary | ICD-10-CM | POA: Diagnosis not present

## 2020-04-24 DIAGNOSIS — M25662 Stiffness of left knee, not elsewhere classified: Secondary | ICD-10-CM | POA: Diagnosis not present

## 2020-04-30 DIAGNOSIS — M6281 Muscle weakness (generalized): Secondary | ICD-10-CM | POA: Diagnosis not present

## 2020-04-30 DIAGNOSIS — M25562 Pain in left knee: Secondary | ICD-10-CM | POA: Diagnosis not present

## 2020-04-30 DIAGNOSIS — M25662 Stiffness of left knee, not elsewhere classified: Secondary | ICD-10-CM | POA: Diagnosis not present

## 2020-05-02 DIAGNOSIS — M25662 Stiffness of left knee, not elsewhere classified: Secondary | ICD-10-CM | POA: Diagnosis not present

## 2020-05-02 DIAGNOSIS — M25562 Pain in left knee: Secondary | ICD-10-CM | POA: Diagnosis not present

## 2020-05-02 DIAGNOSIS — M6281 Muscle weakness (generalized): Secondary | ICD-10-CM | POA: Diagnosis not present

## 2020-05-03 DIAGNOSIS — R0602 Shortness of breath: Secondary | ICD-10-CM | POA: Diagnosis not present

## 2020-05-09 DIAGNOSIS — M25562 Pain in left knee: Secondary | ICD-10-CM | POA: Diagnosis not present

## 2020-05-09 DIAGNOSIS — M6281 Muscle weakness (generalized): Secondary | ICD-10-CM | POA: Diagnosis not present

## 2020-05-09 DIAGNOSIS — D510 Vitamin B12 deficiency anemia due to intrinsic factor deficiency: Secondary | ICD-10-CM | POA: Diagnosis not present

## 2020-05-09 DIAGNOSIS — N179 Acute kidney failure, unspecified: Secondary | ICD-10-CM | POA: Diagnosis not present

## 2020-05-09 DIAGNOSIS — M25662 Stiffness of left knee, not elsewhere classified: Secondary | ICD-10-CM | POA: Diagnosis not present

## 2020-05-11 DIAGNOSIS — N179 Acute kidney failure, unspecified: Secondary | ICD-10-CM | POA: Diagnosis not present

## 2020-05-17 DIAGNOSIS — M25562 Pain in left knee: Secondary | ICD-10-CM | POA: Diagnosis not present

## 2020-05-17 DIAGNOSIS — M25662 Stiffness of left knee, not elsewhere classified: Secondary | ICD-10-CM | POA: Diagnosis not present

## 2020-05-17 DIAGNOSIS — M6281 Muscle weakness (generalized): Secondary | ICD-10-CM | POA: Diagnosis not present

## 2020-05-21 DIAGNOSIS — M25562 Pain in left knee: Secondary | ICD-10-CM | POA: Diagnosis not present

## 2020-05-21 DIAGNOSIS — M6281 Muscle weakness (generalized): Secondary | ICD-10-CM | POA: Diagnosis not present

## 2020-05-21 DIAGNOSIS — M25662 Stiffness of left knee, not elsewhere classified: Secondary | ICD-10-CM | POA: Diagnosis not present

## 2020-05-22 DIAGNOSIS — N179 Acute kidney failure, unspecified: Secondary | ICD-10-CM | POA: Diagnosis not present

## 2020-05-23 DIAGNOSIS — M6281 Muscle weakness (generalized): Secondary | ICD-10-CM | POA: Diagnosis not present

## 2020-05-23 DIAGNOSIS — M25662 Stiffness of left knee, not elsewhere classified: Secondary | ICD-10-CM | POA: Diagnosis not present

## 2020-05-23 DIAGNOSIS — M25562 Pain in left knee: Secondary | ICD-10-CM | POA: Diagnosis not present

## 2020-05-29 DIAGNOSIS — M25662 Stiffness of left knee, not elsewhere classified: Secondary | ICD-10-CM | POA: Diagnosis not present

## 2020-05-29 DIAGNOSIS — M25562 Pain in left knee: Secondary | ICD-10-CM | POA: Diagnosis not present

## 2020-05-29 DIAGNOSIS — M6281 Muscle weakness (generalized): Secondary | ICD-10-CM | POA: Diagnosis not present

## 2020-05-31 DIAGNOSIS — M25562 Pain in left knee: Secondary | ICD-10-CM | POA: Diagnosis not present

## 2020-05-31 DIAGNOSIS — M6281 Muscle weakness (generalized): Secondary | ICD-10-CM | POA: Diagnosis not present

## 2020-05-31 DIAGNOSIS — M25662 Stiffness of left knee, not elsewhere classified: Secondary | ICD-10-CM | POA: Diagnosis not present

## 2020-06-04 DIAGNOSIS — M25562 Pain in left knee: Secondary | ICD-10-CM | POA: Diagnosis not present

## 2020-06-04 DIAGNOSIS — M25662 Stiffness of left knee, not elsewhere classified: Secondary | ICD-10-CM | POA: Diagnosis not present

## 2020-06-04 DIAGNOSIS — M6281 Muscle weakness (generalized): Secondary | ICD-10-CM | POA: Diagnosis not present

## 2020-06-06 DIAGNOSIS — M25662 Stiffness of left knee, not elsewhere classified: Secondary | ICD-10-CM | POA: Diagnosis not present

## 2020-06-06 DIAGNOSIS — M6281 Muscle weakness (generalized): Secondary | ICD-10-CM | POA: Diagnosis not present

## 2020-06-06 DIAGNOSIS — M25562 Pain in left knee: Secondary | ICD-10-CM | POA: Diagnosis not present

## 2020-06-11 DIAGNOSIS — M25562 Pain in left knee: Secondary | ICD-10-CM | POA: Diagnosis not present

## 2020-06-11 DIAGNOSIS — M6281 Muscle weakness (generalized): Secondary | ICD-10-CM | POA: Diagnosis not present

## 2020-06-11 DIAGNOSIS — M25662 Stiffness of left knee, not elsewhere classified: Secondary | ICD-10-CM | POA: Diagnosis not present

## 2020-06-18 DIAGNOSIS — M25662 Stiffness of left knee, not elsewhere classified: Secondary | ICD-10-CM | POA: Diagnosis not present

## 2020-06-18 DIAGNOSIS — M6281 Muscle weakness (generalized): Secondary | ICD-10-CM | POA: Diagnosis not present

## 2020-06-18 DIAGNOSIS — R0602 Shortness of breath: Secondary | ICD-10-CM | POA: Diagnosis not present

## 2020-06-18 DIAGNOSIS — R0902 Hypoxemia: Secondary | ICD-10-CM | POA: Diagnosis not present

## 2020-06-18 DIAGNOSIS — E1169 Type 2 diabetes mellitus with other specified complication: Secondary | ICD-10-CM | POA: Diagnosis not present

## 2020-06-18 DIAGNOSIS — N1832 Chronic kidney disease, stage 3b: Secondary | ICD-10-CM | POA: Diagnosis not present

## 2020-06-18 DIAGNOSIS — M25562 Pain in left knee: Secondary | ICD-10-CM | POA: Diagnosis not present

## 2020-06-20 DIAGNOSIS — M25662 Stiffness of left knee, not elsewhere classified: Secondary | ICD-10-CM | POA: Diagnosis not present

## 2020-06-20 DIAGNOSIS — M6281 Muscle weakness (generalized): Secondary | ICD-10-CM | POA: Diagnosis not present

## 2020-06-20 DIAGNOSIS — M25562 Pain in left knee: Secondary | ICD-10-CM | POA: Diagnosis not present

## 2020-06-28 DIAGNOSIS — M25562 Pain in left knee: Secondary | ICD-10-CM | POA: Diagnosis not present

## 2020-06-28 DIAGNOSIS — M25662 Stiffness of left knee, not elsewhere classified: Secondary | ICD-10-CM | POA: Diagnosis not present

## 2020-06-28 DIAGNOSIS — M6281 Muscle weakness (generalized): Secondary | ICD-10-CM | POA: Diagnosis not present

## 2020-07-02 DIAGNOSIS — E538 Deficiency of other specified B group vitamins: Secondary | ICD-10-CM | POA: Diagnosis not present

## 2020-07-04 DIAGNOSIS — Z23 Encounter for immunization: Secondary | ICD-10-CM | POA: Diagnosis not present

## 2020-08-10 DIAGNOSIS — Z23 Encounter for immunization: Secondary | ICD-10-CM | POA: Diagnosis not present

## 2020-08-21 DIAGNOSIS — D485 Neoplasm of uncertain behavior of skin: Secondary | ICD-10-CM | POA: Diagnosis not present

## 2020-08-21 DIAGNOSIS — L570 Actinic keratosis: Secondary | ICD-10-CM | POA: Diagnosis not present

## 2020-08-21 DIAGNOSIS — I872 Venous insufficiency (chronic) (peripheral): Secondary | ICD-10-CM | POA: Diagnosis not present

## 2020-08-21 DIAGNOSIS — Z85828 Personal history of other malignant neoplasm of skin: Secondary | ICD-10-CM | POA: Diagnosis not present

## 2020-08-21 DIAGNOSIS — L821 Other seborrheic keratosis: Secondary | ICD-10-CM | POA: Diagnosis not present

## 2020-08-21 DIAGNOSIS — D0472 Carcinoma in situ of skin of left lower limb, including hip: Secondary | ICD-10-CM | POA: Diagnosis not present

## 2020-08-27 DIAGNOSIS — H9201 Otalgia, right ear: Secondary | ICD-10-CM | POA: Diagnosis not present

## 2020-09-05 DIAGNOSIS — Z20822 Contact with and (suspected) exposure to covid-19: Secondary | ICD-10-CM | POA: Diagnosis not present

## 2020-09-05 DIAGNOSIS — Z20828 Contact with and (suspected) exposure to other viral communicable diseases: Secondary | ICD-10-CM | POA: Diagnosis not present

## 2020-09-10 DIAGNOSIS — M109 Gout, unspecified: Secondary | ICD-10-CM | POA: Diagnosis not present

## 2020-09-11 DIAGNOSIS — K219 Gastro-esophageal reflux disease without esophagitis: Secondary | ICD-10-CM | POA: Diagnosis not present

## 2020-09-11 DIAGNOSIS — R1310 Dysphagia, unspecified: Secondary | ICD-10-CM | POA: Diagnosis not present

## 2020-09-11 DIAGNOSIS — K229 Disease of esophagus, unspecified: Secondary | ICD-10-CM | POA: Diagnosis not present

## 2020-09-11 DIAGNOSIS — K449 Diaphragmatic hernia without obstruction or gangrene: Secondary | ICD-10-CM | POA: Diagnosis not present

## 2020-09-11 DIAGNOSIS — K2270 Barrett's esophagus without dysplasia: Secondary | ICD-10-CM | POA: Diagnosis not present

## 2020-09-11 DIAGNOSIS — K2100 Gastro-esophageal reflux disease with esophagitis, without bleeding: Secondary | ICD-10-CM | POA: Diagnosis not present

## 2020-09-11 DIAGNOSIS — K222 Esophageal obstruction: Secondary | ICD-10-CM | POA: Diagnosis not present

## 2020-10-08 DIAGNOSIS — E538 Deficiency of other specified B group vitamins: Secondary | ICD-10-CM | POA: Diagnosis not present

## 2020-10-15 DIAGNOSIS — E1169 Type 2 diabetes mellitus with other specified complication: Secondary | ICD-10-CM | POA: Diagnosis not present

## 2020-10-15 DIAGNOSIS — N1832 Chronic kidney disease, stage 3b: Secondary | ICD-10-CM | POA: Diagnosis not present

## 2020-10-15 DIAGNOSIS — E785 Hyperlipidemia, unspecified: Secondary | ICD-10-CM | POA: Diagnosis not present

## 2020-10-15 DIAGNOSIS — E781 Pure hyperglyceridemia: Secondary | ICD-10-CM | POA: Diagnosis not present

## 2020-10-15 DIAGNOSIS — E039 Hypothyroidism, unspecified: Secondary | ICD-10-CM | POA: Diagnosis not present

## 2020-10-15 DIAGNOSIS — Z79899 Other long term (current) drug therapy: Secondary | ICD-10-CM | POA: Diagnosis not present

## 2020-10-16 DIAGNOSIS — D485 Neoplasm of uncertain behavior of skin: Secondary | ICD-10-CM | POA: Diagnosis not present

## 2020-10-16 DIAGNOSIS — L821 Other seborrheic keratosis: Secondary | ICD-10-CM | POA: Diagnosis not present

## 2020-10-16 DIAGNOSIS — L918 Other hypertrophic disorders of the skin: Secondary | ICD-10-CM | POA: Diagnosis not present

## 2020-10-16 DIAGNOSIS — L820 Inflamed seborrheic keratosis: Secondary | ICD-10-CM | POA: Diagnosis not present

## 2020-10-16 DIAGNOSIS — L570 Actinic keratosis: Secondary | ICD-10-CM | POA: Diagnosis not present

## 2020-10-16 DIAGNOSIS — M1711 Unilateral primary osteoarthritis, right knee: Secondary | ICD-10-CM | POA: Diagnosis not present

## 2020-10-16 DIAGNOSIS — D0472 Carcinoma in situ of skin of left lower limb, including hip: Secondary | ICD-10-CM | POA: Diagnosis not present

## 2020-10-16 DIAGNOSIS — L578 Other skin changes due to chronic exposure to nonionizing radiation: Secondary | ICD-10-CM | POA: Diagnosis not present

## 2020-10-16 DIAGNOSIS — W908XXS Exposure to other nonionizing radiation, sequela: Secondary | ICD-10-CM | POA: Diagnosis not present

## 2020-10-16 DIAGNOSIS — D1801 Hemangioma of skin and subcutaneous tissue: Secondary | ICD-10-CM | POA: Diagnosis not present

## 2020-10-16 DIAGNOSIS — G8929 Other chronic pain: Secondary | ICD-10-CM | POA: Insufficient documentation

## 2020-10-18 DIAGNOSIS — M8949 Other hypertrophic osteoarthropathy, multiple sites: Secondary | ICD-10-CM | POA: Diagnosis not present

## 2020-10-18 DIAGNOSIS — M1A09X0 Idiopathic chronic gout, multiple sites, without tophus (tophi): Secondary | ICD-10-CM | POA: Diagnosis not present

## 2021-01-07 DIAGNOSIS — R0602 Shortness of breath: Secondary | ICD-10-CM | POA: Diagnosis not present

## 2021-01-07 DIAGNOSIS — F411 Generalized anxiety disorder: Secondary | ICD-10-CM | POA: Diagnosis not present

## 2021-01-07 DIAGNOSIS — K2100 Gastro-esophageal reflux disease with esophagitis, without bleeding: Secondary | ICD-10-CM | POA: Diagnosis not present

## 2021-01-07 DIAGNOSIS — E1169 Type 2 diabetes mellitus with other specified complication: Secondary | ICD-10-CM | POA: Diagnosis not present

## 2021-02-11 DIAGNOSIS — J40 Bronchitis, not specified as acute or chronic: Secondary | ICD-10-CM | POA: Diagnosis not present

## 2021-02-11 DIAGNOSIS — J329 Chronic sinusitis, unspecified: Secondary | ICD-10-CM | POA: Diagnosis not present

## 2021-02-11 DIAGNOSIS — E1169 Type 2 diabetes mellitus with other specified complication: Secondary | ICD-10-CM | POA: Diagnosis not present

## 2021-02-11 DIAGNOSIS — Z20828 Contact with and (suspected) exposure to other viral communicable diseases: Secondary | ICD-10-CM | POA: Diagnosis not present

## 2021-02-20 DIAGNOSIS — Z1231 Encounter for screening mammogram for malignant neoplasm of breast: Secondary | ICD-10-CM | POA: Diagnosis not present

## 2021-03-06 DIAGNOSIS — R922 Inconclusive mammogram: Secondary | ICD-10-CM | POA: Diagnosis not present

## 2021-03-06 DIAGNOSIS — R928 Other abnormal and inconclusive findings on diagnostic imaging of breast: Secondary | ICD-10-CM | POA: Diagnosis not present

## 2021-03-06 DIAGNOSIS — R921 Mammographic calcification found on diagnostic imaging of breast: Secondary | ICD-10-CM | POA: Diagnosis not present

## 2021-03-19 DIAGNOSIS — N6489 Other specified disorders of breast: Secondary | ICD-10-CM | POA: Diagnosis not present

## 2021-03-19 DIAGNOSIS — R921 Mammographic calcification found on diagnostic imaging of breast: Secondary | ICD-10-CM | POA: Diagnosis not present

## 2021-03-25 DIAGNOSIS — E559 Vitamin D deficiency, unspecified: Secondary | ICD-10-CM | POA: Diagnosis not present

## 2021-03-25 DIAGNOSIS — E1169 Type 2 diabetes mellitus with other specified complication: Secondary | ICD-10-CM | POA: Diagnosis not present

## 2021-03-25 DIAGNOSIS — E785 Hyperlipidemia, unspecified: Secondary | ICD-10-CM | POA: Diagnosis not present

## 2021-03-25 DIAGNOSIS — Z79899 Other long term (current) drug therapy: Secondary | ICD-10-CM | POA: Diagnosis not present

## 2021-04-02 DIAGNOSIS — E119 Type 2 diabetes mellitus without complications: Secondary | ICD-10-CM | POA: Diagnosis not present

## 2021-04-08 DIAGNOSIS — E538 Deficiency of other specified B group vitamins: Secondary | ICD-10-CM | POA: Diagnosis not present

## 2021-04-18 DIAGNOSIS — H5203 Hypermetropia, bilateral: Secondary | ICD-10-CM | POA: Diagnosis not present

## 2021-04-29 DIAGNOSIS — R059 Cough, unspecified: Secondary | ICD-10-CM | POA: Diagnosis not present

## 2021-05-13 DIAGNOSIS — E785 Hyperlipidemia, unspecified: Secondary | ICD-10-CM | POA: Diagnosis not present

## 2021-05-13 DIAGNOSIS — E1169 Type 2 diabetes mellitus with other specified complication: Secondary | ICD-10-CM | POA: Diagnosis not present

## 2021-05-13 DIAGNOSIS — E781 Pure hyperglyceridemia: Secondary | ICD-10-CM | POA: Diagnosis not present

## 2021-05-13 DIAGNOSIS — G720 Drug-induced myopathy: Secondary | ICD-10-CM | POA: Diagnosis not present

## 2021-06-03 DIAGNOSIS — E538 Deficiency of other specified B group vitamins: Secondary | ICD-10-CM | POA: Diagnosis not present

## 2021-06-10 DIAGNOSIS — S61451A Open bite of right hand, initial encounter: Secondary | ICD-10-CM | POA: Diagnosis not present

## 2021-06-10 DIAGNOSIS — W5501XA Bitten by cat, initial encounter: Secondary | ICD-10-CM | POA: Diagnosis not present

## 2021-07-04 DIAGNOSIS — M79601 Pain in right arm: Secondary | ICD-10-CM | POA: Diagnosis not present

## 2021-07-04 DIAGNOSIS — S42291A Other displaced fracture of upper end of right humerus, initial encounter for closed fracture: Secondary | ICD-10-CM | POA: Diagnosis not present

## 2021-07-17 DIAGNOSIS — M6281 Muscle weakness (generalized): Secondary | ICD-10-CM | POA: Diagnosis not present

## 2021-07-17 DIAGNOSIS — M25511 Pain in right shoulder: Secondary | ICD-10-CM | POA: Diagnosis not present

## 2021-07-22 DIAGNOSIS — M25511 Pain in right shoulder: Secondary | ICD-10-CM | POA: Diagnosis not present

## 2021-07-22 DIAGNOSIS — M6281 Muscle weakness (generalized): Secondary | ICD-10-CM | POA: Diagnosis not present

## 2021-07-25 DIAGNOSIS — M6281 Muscle weakness (generalized): Secondary | ICD-10-CM | POA: Diagnosis not present

## 2021-07-25 DIAGNOSIS — M25511 Pain in right shoulder: Secondary | ICD-10-CM | POA: Diagnosis not present

## 2021-07-30 DIAGNOSIS — M6281 Muscle weakness (generalized): Secondary | ICD-10-CM | POA: Diagnosis not present

## 2021-07-30 DIAGNOSIS — M25511 Pain in right shoulder: Secondary | ICD-10-CM | POA: Diagnosis not present

## 2021-08-06 DIAGNOSIS — M25511 Pain in right shoulder: Secondary | ICD-10-CM | POA: Diagnosis not present

## 2021-08-06 DIAGNOSIS — M6281 Muscle weakness (generalized): Secondary | ICD-10-CM | POA: Diagnosis not present

## 2021-08-08 DIAGNOSIS — M6281 Muscle weakness (generalized): Secondary | ICD-10-CM | POA: Diagnosis not present

## 2021-08-08 DIAGNOSIS — M25511 Pain in right shoulder: Secondary | ICD-10-CM | POA: Diagnosis not present

## 2021-08-12 DIAGNOSIS — Z23 Encounter for immunization: Secondary | ICD-10-CM | POA: Diagnosis not present

## 2021-08-14 DIAGNOSIS — M6281 Muscle weakness (generalized): Secondary | ICD-10-CM | POA: Diagnosis not present

## 2021-08-14 DIAGNOSIS — M25511 Pain in right shoulder: Secondary | ICD-10-CM | POA: Diagnosis not present

## 2021-10-01 DIAGNOSIS — E559 Vitamin D deficiency, unspecified: Secondary | ICD-10-CM | POA: Diagnosis not present

## 2021-11-19 DIAGNOSIS — R7301 Impaired fasting glucose: Secondary | ICD-10-CM | POA: Diagnosis not present

## 2021-11-22 DIAGNOSIS — M1A09X0 Idiopathic chronic gout, multiple sites, without tophus (tophi): Secondary | ICD-10-CM | POA: Diagnosis not present

## 2021-11-22 DIAGNOSIS — D6489 Other specified anemias: Secondary | ICD-10-CM | POA: Diagnosis not present

## 2021-11-27 DIAGNOSIS — E538 Deficiency of other specified B group vitamins: Secondary | ICD-10-CM | POA: Diagnosis not present

## 2021-12-03 DIAGNOSIS — R109 Unspecified abdominal pain: Secondary | ICD-10-CM | POA: Diagnosis not present

## 2021-12-03 DIAGNOSIS — N1832 Chronic kidney disease, stage 3b: Secondary | ICD-10-CM | POA: Diagnosis not present

## 2021-12-03 DIAGNOSIS — Z20828 Contact with and (suspected) exposure to other viral communicable diseases: Secondary | ICD-10-CM | POA: Diagnosis not present

## 2021-12-03 DIAGNOSIS — A084 Viral intestinal infection, unspecified: Secondary | ICD-10-CM | POA: Diagnosis not present

## 2021-12-09 DIAGNOSIS — K2270 Barrett's esophagus without dysplasia: Secondary | ICD-10-CM | POA: Diagnosis not present

## 2021-12-09 DIAGNOSIS — K219 Gastro-esophageal reflux disease without esophagitis: Secondary | ICD-10-CM | POA: Diagnosis not present

## 2021-12-09 DIAGNOSIS — R1310 Dysphagia, unspecified: Secondary | ICD-10-CM | POA: Diagnosis not present

## 2021-12-09 DIAGNOSIS — R195 Other fecal abnormalities: Secondary | ICD-10-CM | POA: Diagnosis not present

## 2022-01-03 DIAGNOSIS — E785 Hyperlipidemia, unspecified: Secondary | ICD-10-CM | POA: Diagnosis not present

## 2022-01-03 DIAGNOSIS — E1169 Type 2 diabetes mellitus with other specified complication: Secondary | ICD-10-CM | POA: Diagnosis not present

## 2022-01-03 DIAGNOSIS — E781 Pure hyperglyceridemia: Secondary | ICD-10-CM | POA: Diagnosis not present

## 2022-01-03 DIAGNOSIS — R072 Precordial pain: Secondary | ICD-10-CM | POA: Diagnosis not present

## 2022-03-24 DIAGNOSIS — S76311A Strain of muscle, fascia and tendon of the posterior muscle group at thigh level, right thigh, initial encounter: Secondary | ICD-10-CM | POA: Diagnosis not present

## 2022-03-24 DIAGNOSIS — F411 Generalized anxiety disorder: Secondary | ICD-10-CM | POA: Diagnosis not present

## 2022-04-03 ENCOUNTER — Encounter (HOSPITAL_COMMUNITY): Payer: Self-pay

## 2022-04-03 ENCOUNTER — Emergency Department (HOSPITAL_COMMUNITY): Payer: Medicare Other

## 2022-04-03 ENCOUNTER — Other Ambulatory Visit: Payer: Self-pay

## 2022-04-03 ENCOUNTER — Inpatient Hospital Stay (HOSPITAL_COMMUNITY)
Admission: EM | Admit: 2022-04-03 | Discharge: 2022-04-05 | DRG: 522 | Disposition: A | Discharge status: Home / Self Care | Payer: Medicare Other | Attending: Internal Medicine | Admitting: Internal Medicine

## 2022-04-03 DIAGNOSIS — Z801 Family history of malignant neoplasm of trachea, bronchus and lung: Secondary | ICD-10-CM

## 2022-04-03 DIAGNOSIS — D631 Anemia in chronic kidney disease: Secondary | ICD-10-CM | POA: Diagnosis not present

## 2022-04-03 DIAGNOSIS — N179 Acute kidney failure, unspecified: Secondary | ICD-10-CM | POA: Diagnosis present

## 2022-04-03 DIAGNOSIS — S72002A Fracture of unspecified part of neck of left femur, initial encounter for closed fracture: Secondary | ICD-10-CM | POA: Diagnosis not present

## 2022-04-03 DIAGNOSIS — Z8249 Family history of ischemic heart disease and other diseases of the circulatory system: Secondary | ICD-10-CM | POA: Diagnosis not present

## 2022-04-03 DIAGNOSIS — Z9049 Acquired absence of other specified parts of digestive tract: Secondary | ICD-10-CM

## 2022-04-03 DIAGNOSIS — Z833 Family history of diabetes mellitus: Secondary | ICD-10-CM | POA: Diagnosis not present

## 2022-04-03 DIAGNOSIS — Z85828 Personal history of other malignant neoplasm of skin: Secondary | ICD-10-CM | POA: Diagnosis not present

## 2022-04-03 DIAGNOSIS — F411 Generalized anxiety disorder: Secondary | ICD-10-CM | POA: Diagnosis not present

## 2022-04-03 DIAGNOSIS — Z79890 Hormone replacement therapy (postmenopausal): Secondary | ICD-10-CM | POA: Diagnosis not present

## 2022-04-03 DIAGNOSIS — Z820 Family history of epilepsy and other diseases of the nervous system: Secondary | ICD-10-CM

## 2022-04-03 DIAGNOSIS — Z79899 Other long term (current) drug therapy: Secondary | ICD-10-CM | POA: Diagnosis not present

## 2022-04-03 DIAGNOSIS — Z808 Family history of malignant neoplasm of other organs or systems: Secondary | ICD-10-CM

## 2022-04-03 DIAGNOSIS — G8911 Acute pain due to trauma: Secondary | ICD-10-CM | POA: Diagnosis not present

## 2022-04-03 DIAGNOSIS — S80212A Abrasion, left knee, initial encounter: Secondary | ICD-10-CM | POA: Diagnosis not present

## 2022-04-03 DIAGNOSIS — W1839XA Other fall on same level, initial encounter: Secondary | ICD-10-CM | POA: Diagnosis present

## 2022-04-03 DIAGNOSIS — S50812A Abrasion of left forearm, initial encounter: Secondary | ICD-10-CM | POA: Diagnosis not present

## 2022-04-03 DIAGNOSIS — R251 Tremor, unspecified: Secondary | ICD-10-CM

## 2022-04-03 DIAGNOSIS — K2270 Barrett's esophagus without dysplasia: Secondary | ICD-10-CM | POA: Diagnosis not present

## 2022-04-03 DIAGNOSIS — M25552 Pain in left hip: Secondary | ICD-10-CM | POA: Diagnosis not present

## 2022-04-03 DIAGNOSIS — I10 Essential (primary) hypertension: Secondary | ICD-10-CM

## 2022-04-03 DIAGNOSIS — I129 Hypertensive chronic kidney disease with stage 1 through stage 4 chronic kidney disease, or unspecified chronic kidney disease: Secondary | ICD-10-CM | POA: Diagnosis present

## 2022-04-03 DIAGNOSIS — R7303 Prediabetes: Secondary | ICD-10-CM | POA: Diagnosis present

## 2022-04-03 DIAGNOSIS — E039 Hypothyroidism, unspecified: Secondary | ICD-10-CM | POA: Diagnosis present

## 2022-04-03 DIAGNOSIS — Y92008 Other place in unspecified non-institutional (private) residence as the place of occurrence of the external cause: Secondary | ICD-10-CM

## 2022-04-03 DIAGNOSIS — M79622 Pain in left upper arm: Secondary | ICD-10-CM | POA: Diagnosis not present

## 2022-04-03 DIAGNOSIS — N189 Chronic kidney disease, unspecified: Secondary | ICD-10-CM

## 2022-04-03 DIAGNOSIS — Z96642 Presence of left artificial hip joint: Secondary | ICD-10-CM | POA: Diagnosis not present

## 2022-04-03 DIAGNOSIS — Z7982 Long term (current) use of aspirin: Secondary | ICD-10-CM | POA: Diagnosis not present

## 2022-04-03 DIAGNOSIS — S72092A Other fracture of head and neck of left femur, initial encounter for closed fracture: Secondary | ICD-10-CM | POA: Diagnosis not present

## 2022-04-03 DIAGNOSIS — F419 Anxiety disorder, unspecified: Secondary | ICD-10-CM

## 2022-04-03 DIAGNOSIS — Z471 Aftercare following joint replacement surgery: Secondary | ICD-10-CM | POA: Diagnosis not present

## 2022-04-03 DIAGNOSIS — Z96652 Presence of left artificial knee joint: Secondary | ICD-10-CM | POA: Diagnosis present

## 2022-04-03 DIAGNOSIS — Z87891 Personal history of nicotine dependence: Secondary | ICD-10-CM

## 2022-04-03 DIAGNOSIS — Z043 Encounter for examination and observation following other accident: Secondary | ICD-10-CM | POA: Diagnosis not present

## 2022-04-03 DIAGNOSIS — J45909 Unspecified asthma, uncomplicated: Secondary | ICD-10-CM | POA: Diagnosis present

## 2022-04-03 DIAGNOSIS — N1831 Chronic kidney disease, stage 3a: Secondary | ICD-10-CM | POA: Diagnosis not present

## 2022-04-03 DIAGNOSIS — M1611 Unilateral primary osteoarthritis, right hip: Secondary | ICD-10-CM | POA: Diagnosis not present

## 2022-04-03 DIAGNOSIS — M810 Age-related osteoporosis without current pathological fracture: Secondary | ICD-10-CM | POA: Diagnosis present

## 2022-04-03 DIAGNOSIS — D62 Acute posthemorrhagic anemia: Secondary | ICD-10-CM | POA: Diagnosis not present

## 2022-04-03 DIAGNOSIS — K219 Gastro-esophageal reflux disease without esophagitis: Secondary | ICD-10-CM | POA: Diagnosis present

## 2022-04-03 DIAGNOSIS — R001 Bradycardia, unspecified: Secondary | ICD-10-CM | POA: Diagnosis not present

## 2022-04-03 DIAGNOSIS — S72009A Fracture of unspecified part of neck of unspecified femur, initial encounter for closed fracture: Secondary | ICD-10-CM | POA: Diagnosis present

## 2022-04-03 DIAGNOSIS — M25512 Pain in left shoulder: Secondary | ICD-10-CM | POA: Diagnosis not present

## 2022-04-03 DIAGNOSIS — M25562 Pain in left knee: Secondary | ICD-10-CM | POA: Diagnosis not present

## 2022-04-03 LAB — CBC WITH DIFFERENTIAL/PLATELET
Abs Immature Granulocytes: 0.02 10*3/uL (ref 0.00–0.07)
Basophils Absolute: 0.1 10*3/uL (ref 0.0–0.1)
Basophils Relative: 1 %
Eosinophils Absolute: 0.3 10*3/uL (ref 0.0–0.5)
Eosinophils Relative: 4 %
HCT: 40.5 % (ref 36.0–46.0)
Hemoglobin: 13.3 g/dL (ref 12.0–15.0)
Immature Granulocytes: 0 %
Lymphocytes Relative: 31 %
Lymphs Abs: 2.4 10*3/uL (ref 0.7–4.0)
MCH: 30.9 pg (ref 26.0–34.0)
MCHC: 32.8 g/dL (ref 30.0–36.0)
MCV: 94 fL (ref 80.0–100.0)
Monocytes Absolute: 0.9 10*3/uL (ref 0.1–1.0)
Monocytes Relative: 11 %
Neutro Abs: 4 10*3/uL (ref 1.7–7.7)
Neutrophils Relative %: 53 %
Platelets: 176 10*3/uL (ref 150–400)
RBC: 4.31 MIL/uL (ref 3.87–5.11)
RDW: 13 % (ref 11.5–15.5)
WBC: 7.6 10*3/uL (ref 4.0–10.5)
nRBC: 0 % (ref 0.0–0.2)

## 2022-04-03 LAB — TYPE AND SCREEN
ABO/RH(D): O POS
Antibody Screen: NEGATIVE

## 2022-04-03 LAB — BASIC METABOLIC PANEL
Anion gap: 12 (ref 5–15)
BUN: 25 mg/dL — ABNORMAL HIGH (ref 8–23)
CO2: 20 mmol/L — ABNORMAL LOW (ref 22–32)
Calcium: 9.1 mg/dL (ref 8.9–10.3)
Chloride: 106 mmol/L (ref 98–111)
Creatinine, Ser: 1.85 mg/dL — ABNORMAL HIGH (ref 0.44–1.00)
GFR, Estimated: 29 mL/min — ABNORMAL LOW (ref >60)
Glucose, Bld: 104 mg/dL — ABNORMAL HIGH (ref 70–99)
Potassium: 3.9 mmol/L (ref 3.5–5.1)
Sodium: 138 mmol/L (ref 135–145)

## 2022-04-03 LAB — PROTIME-INR
INR: 1.1 (ref 0.8–1.2)
Prothrombin Time: 13.9 seconds (ref 11.4–15.2)

## 2022-04-03 MED ORDER — VENLAFAXINE HCL ER 150 MG PO CP24
150.0000 mg | ORAL_CAPSULE | Freq: Every day | ORAL | Status: DC
  Administered 2022-04-04 – 2022-04-05 (×2): 150 mg via ORAL
  Filled 2022-04-03 (×2): qty 1

## 2022-04-03 MED ORDER — HYDROCODONE-ACETAMINOPHEN 5-325 MG PO TABS
1.0000 | ORAL_TABLET | Freq: Four times a day (QID) | ORAL | Status: DC | PRN
  Filled 2022-04-03: qty 1

## 2022-04-03 MED ORDER — VENLAFAXINE HCL 37.5 MG PO TABS
37.5000 mg | ORAL_TABLET | Freq: Every day | ORAL | Status: DC
  Administered 2022-04-03 – 2022-04-04 (×2): 37.5 mg via ORAL
  Filled 2022-04-03 (×2): qty 1

## 2022-04-03 MED ORDER — SODIUM CHLORIDE 0.9 % IV SOLN: INTRAVENOUS | Status: AC

## 2022-04-03 MED ORDER — LEVOTHYROXINE SODIUM 25 MCG PO TABS
125.0000 ug | ORAL_TABLET | Freq: Every day | ORAL | Status: DC
  Administered 2022-04-05: 125 ug via ORAL
  Filled 2022-04-03: qty 1

## 2022-04-03 MED ORDER — PANTOPRAZOLE SODIUM 40 MG PO TBEC
40.0000 mg | DELAYED_RELEASE_TABLET | Freq: Every day | ORAL | Status: DC
  Administered 2022-04-04: 40 mg via ORAL
  Filled 2022-04-03: qty 1

## 2022-04-03 MED ORDER — POLYETHYLENE GLYCOL 3350 17 G PO PACK: 17.0000 g | PACK | Freq: Every day | ORAL | Status: DC | PRN

## 2022-04-03 MED ORDER — ATENOLOL 50 MG PO TABS
50.0000 mg | ORAL_TABLET | Freq: Two times a day (BID) | ORAL | Status: DC
  Administered 2022-04-04 – 2022-04-05 (×3): 50 mg via ORAL
  Filled 2022-04-03 (×3): qty 1
  Filled 2022-04-03: qty 2

## 2022-04-03 MED ORDER — ALPRAZOLAM 0.5 MG PO TABS
1.0000 mg | ORAL_TABLET | Freq: Every day | ORAL | Status: DC
  Administered 2022-04-03 – 2022-04-04 (×2): 1 mg via ORAL
  Filled 2022-04-03: qty 4
  Filled 2022-04-03: qty 2

## 2022-04-03 MED ORDER — HYDROMORPHONE HCL 1 MG/ML IJ SOLN: 0.5000 mg | INTRAMUSCULAR | Status: DC | PRN

## 2022-04-03 NOTE — H&P (Signed)
History and Physical   Christina Garner:[id number] DOB: 07-05-1951 DOA: 04/03/2022  PCP: Venetia Maxon, Sharon Mt, MD   Patient coming from: Home/urgent care/outside hospital  Chief Complaint: Fall, hip pain  HPI: Christina Garner is a 71 y.o. female with medical history significant of LPR, GERD, Barrett's esophagus, CKD, anxiety, hypothyroidism, hypertension, asthma, chronic pain, status post left total knee replacement presenting with fall and hip pain.  Patient states that she fell last night in her garage.  Unsure when she fell. But denies any intoxication, denies tripping that she knows of, denies slipping despite not wearing shoes, denies any prodrome. No lightheadedness nor dizziness.  She states she did not hit her head she fell on her left side and had pain on her left side worse at her hip but also including her shoulder and knee.  She went to urgent care who referred her to [facility] for [facility] to recommended transfer to [facility] based on results late.  Denies fever, chills, chest pain, shortness of breath, abdominal pain, constipation, diarrhea, nausea, vomiting.   ED Course: Vital signs in the ED stable.  Lab work-up included BMP with bicarb 20, BUN 25, creatinine of 1.85 similar to previous a year ago at 1.75, glucose 104.  CBC within normal limits.  PT and INR normal.  Patient has been typed and screened in the ED.  Chest x-ray showed no acute normality.  Left hip x-ray showed left femoral neck fracture minimally displaced.  Orthopedics consulted and will see the patient tomorrow with plan for surgery.  N.p.o. at midnight.  Review of Systems: As per HPI otherwise all other systems reviewed and are negative.  Past Medical History:  Diagnosis Date   Acid reflux    Adult hypothyroidism    Anxiety    Arthritis    Asthma    Barrett's esophagus    Cancer ([place])    skin cancer on leg and back   Chronic kidney disease    GAD (generalized anxiety disorder)    GERD  (gastroesophageal reflux disease)    Headache    High blood pressure    Hypothyroidism    Pneumonia    Pre-diabetes    Tremor     Past Surgical History:  Procedure Laterality Date   CHOLECYSTECTOMY  1998   COLONOSCOPY     ESOPHAGOGASTRODUODENOSCOPY ENDOSCOPY     TONSILLECTOMY     TOTAL KNEE ARTHROPLASTY Left 04/09/2020   Procedure: TOTAL KNEE ARTHROPLASTY;  Surgeon: Vickey Huger, MD;  Location: [facility] ORS;  Service: Orthopedics;  Laterality: Left;    Social History  reports that she quit smoking about 52 years ago. Her smoking use included cigarettes. She started smoking about 54 years ago. She has a 1.50 pack-year smoking history. She has never used smokeless tobacco. She reports that she does not drink alcohol and does not use drugs.  Allergies  Allergen Reactions   Allopurinol Other (See Comments)    Kidney Failure   Gabapentin Nausea And Vomiting   Metformin And Related Other (See Comments)    Kidney Problems.   Penicillins     Rash.  TOLERATED ANCEF 04/09/2020.   Primidone Other (See Comments)    Made pt feel unlike herself     Family History  Problem Relation Age of Onset   Asthma Mother    Diabetes Mother    Atrial fibrillation Mother    Heart Problems Mother    Lung cancer Father    Rheum arthritis Father  Parkinson's disease Father    Heart Problems Father    High Cholesterol Sister    Thyroid cancer Sister    Heart attack Brother    High Cholesterol Brother    Migraines Brother    High Cholesterol Sister    Migraines Sister   Reviewed on admission  Prior to Admission medications   Medication Sig Start Date End Date Taking? Authorizing Provider  ALPRAZolam Duanne Moron) 1 MG tablet Take 1 mg by mouth at bedtime.  02/05/15   [provider]  aspirin EC 325 MG EC tablet Take 1 tablet (325 mg total) by mouth 2 (two) times daily. 04/10/20   Donia Ast, PA  atenolol (TENORMIN) 50 MG tablet Take 50 mg by mouth 2 (two) times daily.     [provider]  cyanocobalamin (,VITAMIN B-12,) 1000 MCG/ML injection Inject 1,000 mcg into the muscle every 30 (thirty) days.    [provider]  famotidine (PEPCID) 40 MG tablet Take 40 mg by mouth at bedtime.    [provider]  hydrochlorothiazide (HYDRODIURIL) 50 MG tablet Take 50-100 mg by mouth daily. Swelling in feet    [provider]  levothyroxine (SYNTHROID) 125 MCG tablet Take 125 mcg by mouth daily before breakfast.     [provider]  losartan (COZAAR) 100 MG tablet Take 100 mg by mouth daily.    [provider]  Multiple Vitamins-Minerals (MULTIVITAMIN WITH MINERALS) tablet Take 1 tablet by mouth daily.    [provider]  omeprazole (PRILOSEC) 40 MG capsule Take 1 capsule (40 mg total) by mouth 2 (two) times daily. [place] AND DINNER Patient taking differently: Take 40 mg by mouth daily. 30MINS BEFORE BREAKFAST 01/14/16   Noralee Space, MD  oxyCODONE (OXY IR/ROXICODONE) 5 MG immediate release tablet Take 1-2 tablets (5-10 mg total) by mouth every 6 (six) hours as needed for moderate pain (pain score 4-6). 04/10/20   Donia Ast, PA  SUMAtriptan (IMITREX) 100 MG tablet Take 100 mg by mouth every 2 (two) hours as needed for migraine. May repeat in 2 hours if headache persists or recurs.    [provider]  tiZANidine (ZANAFLEX) 4 MG tablet Take 1 tablet (4 mg total) by mouth every 6 (six) hours as needed for muscle spasms. 04/10/20   Donia Ast, PA  triamcinolone cream (KENALOG) 0.1 % Apply 1 application topically daily as needed (irritation).     [provider]  venlafaxine XR (EFFEXOR-XR) 150 MG 24 hr capsule Take 150 mg by mouth daily. 11/24/14   [provider]    Physical Exam: Vitals:   04/03/22 1558 04/03/22 1559  BP: (!) 123/96   Pulse: 62   Resp: 18   Temp: 98.4 F (36.9 C)   TempSrc: Oral   SpO2: 99%   Weight:  99.3 kg  Height:  '5\' 6"'$  (1.676 m)     Physical Exam Constitutional:      General: She is not in acute distress.    Appearance: Normal appearance. She is obese.  HENT:     Head: Normocephalic and atraumatic.     Mouth/Throat:     Mouth: Mucous membranes are moist.     Pharynx: Oropharynx is clear.  Eyes:     Extraocular Movements: Extraocular movements intact.     Pupils: Pupils are equal, round, and reactive to light.  Cardiovascular:     Rate and Rhythm: Normal rate and regular rhythm.     Pulses:  Normal pulses.     Heart sounds: Normal heart sounds.  Pulmonary:     Effort: Pulmonary effort is normal. No respiratory distress.     Breath sounds: Normal breath sounds.  Abdominal:     General: Bowel sounds are normal. There is no distension.     Palpations: Abdomen is soft.     Tenderness: There is no abdominal tenderness.  Musculoskeletal:        General: No swelling or deformity.     Comments:  neurovascularly intact bilateral lower extremities  Skin:    General: Skin is warm and dry.  Neurological:     General: No focal deficit present.     Mental Status: Mental status is at baseline.    Labs on Admission: I have personally reviewed following labs and imaging studies  CBC: Recent Labs  Lab 04/03/22 1718  WBC 7.6  NEUTROABS 4.0  HGB 13.3  HCT 40.5  MCV 94.0  PLT 127    Basic Metabolic Panel: Recent Labs  Lab 04/03/22 1718  NA 138  K 3.9  CL 106  CO2 20*  GLUCOSE 104*  BUN 25*  CREATININE 1.85*  CALCIUM 9.1    GFR: Estimated Creatinine Clearance: 32.7 mL/min (A) (by C-G formula based on SCr of 1.85 mg/dL (H)).  Liver Function Tests: No results for input(s): "AST", "ALT", "ALKPHOS", "BILITOT", "PROT", "ALBUMIN" in the last 168 hours.  Urine analysis: No results found for: "COLORURINE", "APPEARANCEUR", "LABSPEC", "PHURINE", "GLUCOSEU", "HGBUR", "BILIRUBINUR", "KETONESUR", "PROTEINUR", "UROBILINOGEN", "NITRITE", "LEUKOCYTESUR"  Radiological Exams on Admission: DG Hip Unilat With  Pelvis 2-3 Views Left  Result Date: 04/03/2022 CLINICAL DATA:  Fall. EXAM: DG HIP (WITH OR WITHOUT PELVIS) 2-3V LEFT COMPARISON:  Hip radiograph earlier today at [facility]: Again seen acute minimally displaced femoral neck fracture. The femoral head remains seated. No change in alignment from prior exam. Pubic rami are intact. Pubic symphyseal chondrocalcinosis. IMPRESSION: Acute minimally displaced left femoral neck fracture, unchanged in alignment from earlier today. Electronically Signed   By: Keith Rake M.D.   On: 04/03/2022 16:56   DG Chest 2 View  Result Date: 04/03/2022 CLINICAL DATA:  Fall. EXAM: CHEST - 2 VIEW COMPARISON:  Chest x-ray 10/15/2015 FINDINGS: The heart size and mediastinal contours are within normal limits. Both lungs are clear. The visualized skeletal structures are unremarkable. IMPRESSION: No active cardiopulmonary disease. Electronically Signed   By: Ronney Asters M.D.   On: 04/03/2022 16:51    EKG: Independently reviewed. Sinus bradycardia 59 bpm.  Assessment/Plan Principal Problem:   Closed displaced fracture of left femoral neck (HCC) Active Problems:   LPRD (laryngopharyngeal reflux disease)   GERD (gastroesophageal reflux disease)   Acute renal failure superimposed on chronic kidney disease (HCC)   HTN (hypertension)   Anxiety   Tremor   Closed displaced fracture of left femoral neck > Patient presenting after fall at [facility] last night.  Found to have fracture of left hip minimally displaced at the femoral neck.  Pain currently controlled. > Orthopedics consulted and will see the patient the morning with plan for surgery, aspirin n.p.o. at midnight. > Does now meet criteria for osteoporosis with fracture following fall from a standing height. - Monitor on MedSurg with continuous pulse ox - Appreciate orthopedics recommendations - Pain control with as needed hydrocodone for moderate to severe pain Dilaudid for severe breakthrough pain -  Supportive care - Bedrest  GERD LPR Barrett's esophagus - Continue PPI  AKI on CKD > Creatinine of 1.85 on admission  with most recent lab being a year ago at 1.75.  We do not have access to outside lab however patient works in [place] and appears to have good medical literacy, she reports she had labs in January with a creatinine of around 1.2. - IV fluids overnight - Trend renal function electrolytes - Avoid nephrotoxic agents  Anxiety - Continue home venlafaxine and as needed Xanax  Hypothyroidism - Continue home Synthroid  Hypertension - Hold home losartan and torsemide in the setting of AKI as above.  Tremor - Continue home atenolol  DVT prophylaxis: SCDs Code Status:   Full Family Communication:  None on admission. Disposition Plan:   Patient is from:  Home  Anticipated DC to:  Home  Anticipated DC date:  1 to 3 days  Anticipated DC barriers: None  Consults called:  Orthopedics, consulted in the ED Admission status:  Observation, MedSurg  Severity of Illness: The appropriate patient status for this patient is OBSERVATION. Observation status is judged to be reasonable and necessary in order to provide the required intensity of service to ensure the patient's safety. The patient's presenting symptoms, physical exam findings, and initial radiographic and laboratory data in the context of their medical condition is felt to place them at decreased risk for further clinical deterioration. Furthermore, it is anticipated that the patient will be medically stable for discharge from the hospital within 2 midnights of admission.    Christina Bruins MD [facility]  How to contact the [facility] Attending or Consulting provider [place] or covering provider during after hours [place], for this patient?   Check the care team in [facility] and look for a) attending/consulting [facility] provider listed and b) the [facility] team listed Log into [email or web address] and use [place]'s universal  password to access. If you do not have the password, please contact the hospital operator. Locate the [facility] provider you are looking for under [facility] and page to a number that you can be directly reached. If you still have difficulty reaching the provider, please page the [facility] (Director on Call) for the Hospitalists listed on amion for assistance.  04/03/2022, 7:09 PM

## 2022-04-03 NOTE — ED Triage Notes (Signed)
Patient was seen at [facility] and dx with mildly displaced hip fx and refused to come by EMS and came by POV.

## 2022-04-03 NOTE — Progress Notes (Signed)
Case d/w ED.  Pt xrays reviewed.  Will need THA for left hip fracture.  Will need to d/w arthroplasty colleagues in town to find a Psychologist, sport and exercise to do case.  Will hopefully get her on a schedule for tomorrow.  NPO tonight at [place].

## 2022-04-03 NOTE — ED Provider Notes (Signed)
[facility] EMERGENCY DEPARTMENT Provider Note   CSN: [id number] Arrival date & time: 04/03/22  1535     History  Chief Complaint  Patient presents with   Hip Pain    Christina Garner is a 71 y.o. female.  Patient is a 71 year old female with a history of hypertension, Barrett's esophagus, GERD, chronic kidney disease, hypothyroidism, prior skin cancer who presents with left hip pain.  She said she had a fall last night in the garage.  She is not sure what made her fall but she thinks she missed stepped and fell.  She did not have any dizziness or other symptoms preceding the fall.  No syncope.  She did not hit her head.  No neck or back pain.  She has pain primarily in her left hip but also her left shoulder and left knee.  She went to an urgent care who sent her to [facility] to get some x-rays.  She was told that she had a hip fracture and was sent here for further evaluation.  She said that she had x-rays of her left shoulder and left knee that showed no acute injuries.  She is not anticoagulants.       Home Medications Prior to Admission medications   Medication Sig Start Date End Date Taking? Authorizing Provider  ALPRAZolam Duanne Moron) 1 MG tablet Take 1 mg by mouth at bedtime.  02/05/15   [provider]  aspirin EC 325 MG EC tablet Take 1 tablet (325 mg total) by mouth 2 (two) times daily. 04/10/20   Donia Ast, PA  atenolol (TENORMIN) 50 MG tablet Take 50 mg by mouth 2 (two) times daily.     [provider]  cyanocobalamin (,VITAMIN B-12,) 1000 MCG/ML injection Inject 1,000 mcg into the muscle every 30 (thirty) days.    [provider]  famotidine (PEPCID) 40 MG tablet Take 40 mg by mouth at bedtime.    [provider]  hydrochlorothiazide (HYDRODIURIL) 50 MG tablet Take 50-100 mg by mouth daily. Swelling in feet    [provider]  levothyroxine (SYNTHROID) 125 MCG tablet Take 125 mcg by mouth daily  before breakfast.     [provider]  losartan (COZAAR) 100 MG tablet Take 100 mg by mouth daily.    [provider]  Multiple Vitamins-Minerals (MULTIVITAMIN WITH MINERALS) tablet Take 1 tablet by mouth daily.    [provider]  omeprazole (PRILOSEC) 40 MG capsule Take 1 capsule (40 mg total) by mouth 2 (two) times daily. [facility] Patient taking differently: Take 40 mg by mouth daily. 30MINS BEFORE BREAKFAST 01/14/16   Noralee Space, MD  oxyCODONE (OXY IR/ROXICODONE) 5 MG immediate release tablet Take 1-2 tablets (5-10 mg total) by mouth every 6 (six) hours as needed for moderate pain (pain score 4-6). 04/10/20   Donia Ast, PA  SUMAtriptan (IMITREX) 100 MG tablet Take 100 mg by mouth every 2 (two) hours as needed for migraine. May repeat in 2 hours if headache persists or recurs.    [provider]  tiZANidine (ZANAFLEX) 4 MG tablet Take 1 tablet (4 mg total) by mouth every 6 (six) hours as needed for muscle spasms. 04/10/20   Donia Ast, PA  triamcinolone cream (KENALOG) 0.1 % Apply 1 application topically daily as needed (irritation).     [provider]  venlafaxine XR (EFFEXOR-XR) 150 MG 24 hr capsule Take 150 mg by mouth daily. 11/24/14  [provider]      Allergies    Allopurinol, Gabapentin, Metformin and related, Penicillins, and Primidone    Review of Systems   Review of Systems  Constitutional:  Negative for chills, diaphoresis, fatigue and fever.  HENT:  Negative for congestion, rhinorrhea and sneezing.   Eyes: Negative.   Respiratory:  Negative for cough, chest tightness and shortness of breath.   Cardiovascular:  Negative for chest pain and leg swelling.  Gastrointestinal:  Negative for abdominal pain, blood in stool, diarrhea, nausea and vomiting.  Genitourinary:  Negative for difficulty urinating, flank pain, frequency and hematuria.  Musculoskeletal:  Positive for arthralgias.  Negative for back pain.  Skin:  Negative for rash.  Neurological:  Negative for dizziness, speech difficulty, weakness, numbness and headaches.    Physical Exam Updated Vital Signs BP (!) 123/96 (BP Location: Left Arm)   Pulse 62   Temp 98.4 F (36.9 C) (Oral)   Resp 18   Ht '5\' 6"'$  (1.676 m)   Wt 99.3 kg   SpO2 99%   BMI 35.35 kg/m  Physical Exam Constitutional:      Appearance: She is well-developed.  HENT:     Head: Normocephalic and atraumatic.  Eyes:     Pupils: Pupils are equal, round, and reactive to light.  Neck:     Comments: No pain along the spine Cardiovascular:     Rate and Rhythm: Normal rate and regular rhythm.     Heart sounds: Normal heart sounds.  Pulmonary:     Effort: Pulmonary effort is normal. No respiratory distress.     Breath sounds: Normal breath sounds. No wheezing or rales.  Chest:     Chest wall: No tenderness.  Abdominal:     General: Bowel sounds are normal.     Palpations: Abdomen is soft.     Tenderness: There is no abdominal tenderness. There is no guarding or rebound.  Musculoskeletal:        General: Normal range of motion.     Cervical back: Normal range of motion and neck supple.     Comments: Positive tenderness to the left hip.  No shortening or rotation.  Pedal pulses are intact.  There is no significant bony tenderness to the knee or ankle.  No pain on range of motion of the other extremities.  Lymphadenopathy:     Cervical: No cervical adenopathy.  Skin:    General: Skin is warm and dry.     Findings: No rash.  Neurological:     Mental Status: She is alert and oriented to person, place, and time.     ED Results / Procedures / Treatments   Labs (all labs ordered are listed, but only abnormal results are displayed) Labs Reviewed  BASIC METABOLIC PANEL - Abnormal; Notable for the following components:      Result Value   CO2 20 (*)    Glucose, Bld 104 (*)    BUN 25 (*)    Creatinine, Ser 1.85 (*)    GFR, Estimated 29  (*)    All other components within normal limits  CBC WITH DIFFERENTIAL/PLATELET  PROTIME-INR  CBC  BASIC METABOLIC PANEL  TYPE AND SCREEN  ABO/RH    EKG EKG Interpretation  Date/Time:  Thursday April 03 2022 17:17:28 EDT Ventricular Rate:  59 PR Interval:  166 QRS Duration: 84 QT Interval:  454 QTC Calculation: 449 R Axis:   44 Text Interpretation: Sinus bradycardia Otherwise normal ECG No previous ECGs available Confirmed  by Malvin Johns [phone]) on 04/03/2022 5:36:35 PM  Radiology DG Hip Unilat With Pelvis 2-3 Views Left  Result Date: 04/03/2022 CLINICAL DATA:  Fall. EXAM: DG HIP (WITH OR WITHOUT PELVIS) 2-3V LEFT COMPARISON:  Hip radiograph earlier today at [facility]: Again seen acute minimally displaced femoral neck fracture. The femoral head remains seated. No change in alignment from prior exam. Pubic rami are intact. Pubic symphyseal chondrocalcinosis. IMPRESSION: Acute minimally displaced left femoral neck fracture, unchanged in alignment from earlier today. Electronically Signed   By: Keith Rake M.D.   On: 04/03/2022 16:56   DG Chest 2 View  Result Date: 04/03/2022 CLINICAL DATA:  Fall. EXAM: CHEST - 2 VIEW COMPARISON:  Chest x-ray 10/15/2015 FINDINGS: The heart size and mediastinal contours are within normal limits. Both lungs are clear. The visualized skeletal structures are unremarkable. IMPRESSION: No active cardiopulmonary disease. Electronically Signed   By: Ronney Asters M.D.   On: 04/03/2022 16:51    Procedures Procedures    Medications Ordered in ED Medications  levothyroxine (SYNTHROID) tablet 125 mcg (has no administration in time range)  pantoprazole (PROTONIX) EC tablet 40 mg (has no administration in time range)  HYDROcodone-acetaminophen (NORCO/VICODIN) 5-325 MG per tablet 1-2 tablet (has no administration in time range)  HYDROmorphone (DILAUDID) injection 0.5 mg (has no administration in time range)  polyethylene glycol (MIRALAX /  GLYCOLAX) packet 17 g (has no administration in time range)    ED Course/ Medical Decision Making/ A&P                           Medical Decision Making Amount and/or Complexity of Data Reviewed Labs: ordered. Radiology: ordered.  Risk Decision regarding hospitalization.   Patient is a 71 year old female who presents with hip pain after a fall.  She had x-rays done at an outside facility but we do not have access to these images.  X-rays of her left hip were performed which shows a femoral neck fracture.  This was interpreted by me and confirmed by the radiologist.  X-ray of her chest shows no acute abnormalities.  This was also interpreted by me.  She had labs.  Her creatinine is mildly elevated but similar to prior values based on chart review.  She has some mild tenderness in her left shoulder and left knee but reports that she had outside x-rays done that did not show any evidence of fractures.  Given her minimal pain to these areas, I did not feel that these x-rays need to be repeated at this time.  I spoke with Dr. Stann Mainland with orthopedic surgery who will see the patient.  He plans for surgery tomorrow and request patient be n.p.o. after midnight.  I spoke with Dr. Trilby Drummer with the hospitalist service who will admit the patient for further treatment.  Final Clinical Impression(s) / ED Diagnoses Final diagnoses:  Closed fracture of left hip, initial encounter [facility])    Rx / [place] Orders ED Discharge Orders     None         Malvin Johns, MD 04/03/22 1827

## 2022-04-03 NOTE — ED Provider Triage Note (Signed)
Emergency Medicine Provider Triage Evaluation Note  Christina Garner , a 71 y.o. female  was evaluated in triage.  Pt complains of hip fracture from fall  Pt sent from urgent care   Review of Systems  Positive: Difficulty walking Negative: weakness  Physical Exam  BP (!) 123/96 (BP Location: Left Arm)   Pulse 62   Temp 98.4 F (36.9 C) (Oral)   Resp 18   Ht '5\' 6"'$  (1.676 m)   Wt 99.3 kg   SpO2 99%   BMI 35.35 kg/m  Gen:   Awake, no distress   Resp:  Normal effort  MSK:   Moves extremities without difficulty  Other:    Medical Decision Making  Medically screening exam initiated at 4:07 PM.  Appropriate orders placed.  Christina Garner was informed that the remainder of the evaluation will be completed by another provider, this initial triage assessment does not replace that evaluation, and the importance of remaining in the ED until their evaluation is complete.     Fransico Meadow, [place] 04/03/22 1608

## 2022-04-04 ENCOUNTER — Encounter (HOSPITAL_COMMUNITY)
Admission: EM | Disposition: A | Discharge status: Home / Self Care | Payer: Self-pay | Source: Home / Self Care | Attending: Internal Medicine

## 2022-04-04 ENCOUNTER — Inpatient Hospital Stay (HOSPITAL_COMMUNITY): Payer: Medicare Other

## 2022-04-04 ENCOUNTER — Encounter (HOSPITAL_COMMUNITY): Payer: Self-pay | Admitting: Internal Medicine

## 2022-04-04 ENCOUNTER — Encounter (HOSPITAL_COMMUNITY): Payer: Self-pay | Admitting: Anesthesiology

## 2022-04-04 ENCOUNTER — Other Ambulatory Visit: Payer: Self-pay

## 2022-04-04 ENCOUNTER — Inpatient Hospital Stay (HOSPITAL_COMMUNITY): Payer: Self-pay | Admitting: Anesthesiology

## 2022-04-04 ENCOUNTER — Inpatient Hospital Stay (HOSPITAL_COMMUNITY): Payer: Medicare Other | Admitting: Anesthesiology

## 2022-04-04 DIAGNOSIS — M810 Age-related osteoporosis without current pathological fracture: Secondary | ICD-10-CM | POA: Diagnosis present

## 2022-04-04 DIAGNOSIS — Z85828 Personal history of other malignant neoplasm of skin: Secondary | ICD-10-CM | POA: Diagnosis not present

## 2022-04-04 DIAGNOSIS — E039 Hypothyroidism, unspecified: Secondary | ICD-10-CM | POA: Diagnosis not present

## 2022-04-04 DIAGNOSIS — N179 Acute kidney failure, unspecified: Secondary | ICD-10-CM | POA: Diagnosis not present

## 2022-04-04 DIAGNOSIS — N1831 Chronic kidney disease, stage 3a: Secondary | ICD-10-CM | POA: Diagnosis present

## 2022-04-04 DIAGNOSIS — Z471 Aftercare following joint replacement surgery: Secondary | ICD-10-CM | POA: Diagnosis not present

## 2022-04-04 DIAGNOSIS — Z8249 Family history of ischemic heart disease and other diseases of the circulatory system: Secondary | ICD-10-CM | POA: Diagnosis not present

## 2022-04-04 DIAGNOSIS — Z808 Family history of malignant neoplasm of other organs or systems: Secondary | ICD-10-CM | POA: Diagnosis not present

## 2022-04-04 DIAGNOSIS — I10 Essential (primary) hypertension: Secondary | ICD-10-CM | POA: Diagnosis not present

## 2022-04-04 DIAGNOSIS — Z801 Family history of malignant neoplasm of trachea, bronchus and lung: Secondary | ICD-10-CM | POA: Diagnosis not present

## 2022-04-04 DIAGNOSIS — Z79899 Other long term (current) drug therapy: Secondary | ICD-10-CM | POA: Diagnosis not present

## 2022-04-04 DIAGNOSIS — S72002A Fracture of unspecified part of neck of left femur, initial encounter for closed fracture: Secondary | ICD-10-CM | POA: Diagnosis not present

## 2022-04-04 DIAGNOSIS — F411 Generalized anxiety disorder: Secondary | ICD-10-CM | POA: Diagnosis present

## 2022-04-04 DIAGNOSIS — S72009A Fracture of unspecified part of neck of unspecified femur, initial encounter for closed fracture: Secondary | ICD-10-CM | POA: Diagnosis present

## 2022-04-04 DIAGNOSIS — D62 Acute posthemorrhagic anemia: Secondary | ICD-10-CM | POA: Diagnosis not present

## 2022-04-04 DIAGNOSIS — Z9049 Acquired absence of other specified parts of digestive tract: Secondary | ICD-10-CM | POA: Diagnosis not present

## 2022-04-04 DIAGNOSIS — M1611 Unilateral primary osteoarthritis, right hip: Secondary | ICD-10-CM | POA: Diagnosis not present

## 2022-04-04 DIAGNOSIS — Z87891 Personal history of nicotine dependence: Secondary | ICD-10-CM | POA: Diagnosis not present

## 2022-04-04 DIAGNOSIS — D631 Anemia in chronic kidney disease: Secondary | ICD-10-CM | POA: Diagnosis not present

## 2022-04-04 DIAGNOSIS — I129 Hypertensive chronic kidney disease with stage 1 through stage 4 chronic kidney disease, or unspecified chronic kidney disease: Secondary | ICD-10-CM | POA: Diagnosis not present

## 2022-04-04 DIAGNOSIS — Z7982 Long term (current) use of aspirin: Secondary | ICD-10-CM | POA: Diagnosis not present

## 2022-04-04 DIAGNOSIS — N189 Chronic kidney disease, unspecified: Secondary | ICD-10-CM | POA: Diagnosis not present

## 2022-04-04 DIAGNOSIS — G8911 Acute pain due to trauma: Secondary | ICD-10-CM | POA: Diagnosis not present

## 2022-04-04 DIAGNOSIS — Z96642 Presence of left artificial hip joint: Secondary | ICD-10-CM | POA: Diagnosis not present

## 2022-04-04 DIAGNOSIS — Z96652 Presence of left artificial knee joint: Secondary | ICD-10-CM | POA: Diagnosis present

## 2022-04-04 DIAGNOSIS — F419 Anxiety disorder, unspecified: Secondary | ICD-10-CM

## 2022-04-04 DIAGNOSIS — Y92008 Other place in unspecified non-institutional (private) residence as the place of occurrence of the external cause: Secondary | ICD-10-CM | POA: Diagnosis not present

## 2022-04-04 DIAGNOSIS — Z79890 Hormone replacement therapy (postmenopausal): Secondary | ICD-10-CM | POA: Diagnosis not present

## 2022-04-04 DIAGNOSIS — Z833 Family history of diabetes mellitus: Secondary | ICD-10-CM | POA: Diagnosis not present

## 2022-04-04 DIAGNOSIS — K219 Gastro-esophageal reflux disease without esophagitis: Secondary | ICD-10-CM | POA: Diagnosis not present

## 2022-04-04 DIAGNOSIS — Z820 Family history of epilepsy and other diseases of the nervous system: Secondary | ICD-10-CM | POA: Diagnosis not present

## 2022-04-04 DIAGNOSIS — R7303 Prediabetes: Secondary | ICD-10-CM | POA: Diagnosis present

## 2022-04-04 DIAGNOSIS — J45909 Unspecified asthma, uncomplicated: Secondary | ICD-10-CM | POA: Diagnosis present

## 2022-04-04 DIAGNOSIS — S72092A Other fracture of head and neck of left femur, initial encounter for closed fracture: Secondary | ICD-10-CM | POA: Diagnosis present

## 2022-04-04 DIAGNOSIS — K2270 Barrett's esophagus without dysplasia: Secondary | ICD-10-CM | POA: Diagnosis present

## 2022-04-04 DIAGNOSIS — W1839XA Other fall on same level, initial encounter: Secondary | ICD-10-CM | POA: Diagnosis present

## 2022-04-04 HISTORY — PX: TOTAL HIP ARTHROPLASTY: SHX124

## 2022-04-04 LAB — CBC
HCT: 35.6 % — ABNORMAL LOW (ref 36.0–46.0)
Hemoglobin: 11.8 g/dL — ABNORMAL LOW (ref 12.0–15.0)
MCH: 30.3 pg (ref 26.0–34.0)
MCHC: 33.1 g/dL (ref 30.0–36.0)
MCV: 91.5 fL (ref 80.0–100.0)
Platelets: 152 10*3/uL (ref 150–400)
RBC: 3.89 MIL/uL (ref 3.87–5.11)
RDW: 13.2 % (ref 11.5–15.5)
WBC: 6.3 10*3/uL (ref 4.0–10.5)
nRBC: 0 % (ref 0.0–0.2)

## 2022-04-04 LAB — BASIC METABOLIC PANEL
Anion gap: 11 (ref 5–15)
BUN: 25 mg/dL — ABNORMAL HIGH (ref 8–23)
CO2: 22 mmol/L (ref 22–32)
Calcium: 8.6 mg/dL — ABNORMAL LOW (ref 8.9–10.3)
Chloride: 108 mmol/L (ref 98–111)
Creatinine, Ser: 1.69 mg/dL — ABNORMAL HIGH (ref 0.44–1.00)
GFR, Estimated: 32 mL/min — ABNORMAL LOW (ref >60)
Glucose, Bld: 111 mg/dL — ABNORMAL HIGH (ref 70–99)
Potassium: 3.7 mmol/L (ref 3.5–5.1)
Sodium: 141 mmol/L (ref 135–145)

## 2022-04-04 LAB — ABO/RH: ABO/RH(D): O POS

## 2022-04-04 LAB — MRSA NEXT GEN BY PCR, NASAL: MRSA by PCR Next Gen: NOT DETECTED

## 2022-04-04 SURGERY — ARTHROPLASTY, HIP, TOTAL, ANTERIOR APPROACH
Anesthesia: Monitor Anesthesia Care | Site: Hip | Laterality: Left

## 2022-04-04 MED ORDER — ALUM & MAG HYDROXIDE-SIMETH 200-200-20 MG/5ML PO SUSP
30.0000 mL | ORAL | Status: DC | PRN
Start: 2022-04-04 — End: 2022-04-05

## 2022-04-04 MED ORDER — DIPHENHYDRAMINE HCL 12.5 MG/5ML PO ELIX: 12.5000 mg | ORAL_SOLUTION | ORAL | Status: DC | PRN

## 2022-04-04 MED ORDER — HYDROCODONE-ACETAMINOPHEN 7.5-325 MG PO TABS: 1.0000 | ORAL_TABLET | ORAL | Status: DC | PRN

## 2022-04-04 MED ORDER — FENTANYL CITRATE (PF) 100 MCG/2ML IJ SOLN: 25.0000 ug | INTRAMUSCULAR | Status: DC | PRN

## 2022-04-04 MED ORDER — DEXAMETHASONE SODIUM PHOSPHATE 10 MG/ML IJ SOLN
INTRAMUSCULAR | Status: DC | PRN
  Administered 2022-04-04: 10 mg via INTRAVENOUS

## 2022-04-04 MED ORDER — POVIDONE-IODINE 10 % EX SWAB
2.0000 | Freq: Once | CUTANEOUS | Status: AC
  Administered 2022-04-04: 2 via TOPICAL

## 2022-04-04 MED ORDER — ACETAMINOPHEN 325 MG PO TABS: 325.0000 mg | ORAL_TABLET | Freq: Four times a day (QID) | ORAL | Status: DC | PRN

## 2022-04-04 MED ORDER — METHOCARBAMOL 500 MG PO TABS
500.0000 mg | ORAL_TABLET | Freq: Four times a day (QID) | ORAL | Status: DC | PRN
  Administered 2022-04-05: 500 mg via ORAL
  Filled 2022-04-04: qty 1

## 2022-04-04 MED ORDER — TRANEXAMIC ACID-NACL 1000-0.7 MG/100ML-% IV SOLN
1000.0000 mg | INTRAVENOUS | Status: AC
  Administered 2022-04-04: 1000 mg via INTRAVENOUS
  Filled 2022-04-04: qty 100

## 2022-04-04 MED ORDER — MORPHINE SULFATE (PF) 2 MG/ML IV SOLN: 0.5000 mg | INTRAVENOUS | Status: DC | PRN

## 2022-04-04 MED ORDER — PROPOFOL 10 MG/ML IV BOLUS
INTRAVENOUS | Status: DC | PRN
  Administered 2022-04-04: 40 mg via INTRAVENOUS

## 2022-04-04 MED ORDER — SODIUM CHLORIDE 0.9 % IV SOLN: INTRAVENOUS | Status: DC

## 2022-04-04 MED ORDER — ASPIRIN 81 MG PO CHEW
81.0000 mg | CHEWABLE_TABLET | Freq: Two times a day (BID) | ORAL | Status: DC
  Administered 2022-04-04 – 2022-04-05 (×2): 81 mg via ORAL
  Filled 2022-04-04 (×2): qty 1

## 2022-04-04 MED ORDER — CEFAZOLIN SODIUM-DEXTROSE 2-4 GM/100ML-% IV SOLN
2.0000 g | INTRAVENOUS | Status: AC
  Administered 2022-04-04: 2 g via INTRAVENOUS
  Filled 2022-04-04: qty 100

## 2022-04-04 MED ORDER — EPHEDRINE SULFATE (PRESSORS) 50 MG/ML IJ SOLN
INTRAMUSCULAR | Status: DC | PRN
  Administered 2022-04-04: 5 mg via INTRAVENOUS

## 2022-04-04 MED ORDER — PROPOFOL 500 MG/50ML IV EMUL
INTRAVENOUS | Status: DC | PRN
  Administered 2022-04-04: 50 ug/kg/min via INTRAVENOUS

## 2022-04-04 MED ORDER — HYDROCODONE-ACETAMINOPHEN 5-325 MG PO TABS
1.0000 | ORAL_TABLET | ORAL | Status: DC | PRN
  Administered 2022-04-05 (×2): 1 via ORAL
  Filled 2022-04-04: qty 2
  Filled 2022-04-04: qty 1

## 2022-04-04 MED ORDER — PHENYLEPHRINE 80 MCG/ML (10ML) SYRINGE FOR IV PUSH (FOR BLOOD PRESSURE SUPPORT): PREFILLED_SYRINGE | INTRAVENOUS | Status: DC | PRN

## 2022-04-04 MED ORDER — DOCUSATE SODIUM 100 MG PO CAPS
100.0000 mg | ORAL_CAPSULE | Freq: Two times a day (BID) | ORAL | Status: DC
  Administered 2022-04-04 – 2022-04-05 (×2): 100 mg via ORAL
  Filled 2022-04-04 (×2): qty 1

## 2022-04-04 MED ORDER — PHENYLEPHRINE HCL-NACL 20-0.9 MG/250ML-% IV SOLN
INTRAVENOUS | Status: DC | PRN
  Administered 2022-04-04: 40 ug/min via INTRAVENOUS

## 2022-04-04 MED ORDER — LACTATED RINGERS IV SOLN: INTRAVENOUS | Status: DC

## 2022-04-04 MED ORDER — ORAL CARE MOUTH RINSE: 15.0000 mL | Freq: Once | OROMUCOSAL | Status: AC

## 2022-04-04 MED ORDER — FENTANYL CITRATE (PF) 250 MCG/5ML IJ SOLN
INTRAMUSCULAR | Status: AC
  Filled 2022-04-04: qty 5

## 2022-04-04 MED ORDER — ONDANSETRON HCL 4 MG PO TABS: 4.0000 mg | ORAL_TABLET | Freq: Four times a day (QID) | ORAL | Status: DC | PRN

## 2022-04-04 MED ORDER — 0.9 % SODIUM CHLORIDE (POUR BTL) OPTIME
TOPICAL | Status: DC | PRN
  Administered 2022-04-04: 1000 mL

## 2022-04-04 MED ORDER — SODIUM CHLORIDE 0.9 % IR SOLN
Status: DC | PRN
  Administered 2022-04-04: 1000 mL

## 2022-04-04 MED ORDER — METHOCARBAMOL 1000 MG/10ML IJ SOLN
500.0000 mg | Freq: Four times a day (QID) | INTRAVENOUS | Status: DC | PRN
  Filled 2022-04-04: qty 5

## 2022-04-04 MED ORDER — METOCLOPRAMIDE HCL 5 MG/ML IJ SOLN: 5.0000 mg | Freq: Three times a day (TID) | INTRAMUSCULAR | Status: DC | PRN

## 2022-04-04 MED ORDER — BUPIVACAINE IN DEXTROSE 0.75-8.25 % IT SOLN
INTRATHECAL | Status: DC | PRN
  Administered 2022-04-04: 1.8 mL via INTRATHECAL

## 2022-04-04 MED ORDER — ONDANSETRON HCL 4 MG/2ML IJ SOLN: 4.0000 mg | Freq: Four times a day (QID) | INTRAMUSCULAR | Status: DC | PRN

## 2022-04-04 MED ORDER — LIDOCAINE HCL (CARDIAC) PF 100 MG/5ML IV SOSY
PREFILLED_SYRINGE | INTRAVENOUS | Status: DC | PRN
  Administered 2022-04-04: 60 mg via INTRATRACHEAL

## 2022-04-04 MED ORDER — CHLORHEXIDINE GLUCONATE 4 % EX LIQD: 60.0000 mL | Freq: Once | CUTANEOUS | Status: DC

## 2022-04-04 MED ORDER — CEFAZOLIN SODIUM-DEXTROSE 1-4 GM/50ML-% IV SOLN
1.0000 g | Freq: Four times a day (QID) | INTRAVENOUS | Status: AC
  Administered 2022-04-04 – 2022-04-05 (×2): 1 g via INTRAVENOUS
  Filled 2022-04-04 (×2): qty 50

## 2022-04-04 MED ORDER — POLYETHYLENE GLYCOL 3350 17 G PO PACK
17.0000 g | PACK | Freq: Every day | ORAL | Status: DC | PRN
Start: 2022-04-04 — End: 2022-04-05

## 2022-04-04 MED ORDER — CHLORHEXIDINE GLUCONATE 0.12 % MT SOLN: 15.0000 mL | Freq: Once | OROMUCOSAL | Status: AC

## 2022-04-04 MED ORDER — PHENOL 1.4 % MT LIQD: 1.0000 | OROMUCOSAL | Status: DC | PRN

## 2022-04-04 MED ORDER — FENTANYL CITRATE (PF) 100 MCG/2ML IJ SOLN
INTRAMUSCULAR | Status: AC
  Administered 2022-04-04: 50 ug
  Filled 2022-04-04: qty 2

## 2022-04-04 MED ORDER — PANTOPRAZOLE SODIUM 40 MG PO TBEC
40.0000 mg | DELAYED_RELEASE_TABLET | Freq: Every day | ORAL | Status: DC
  Administered 2022-04-05: 40 mg via ORAL
  Filled 2022-04-04: qty 1

## 2022-04-04 MED ORDER — METOCLOPRAMIDE HCL 5 MG PO TABS: 5.0000 mg | ORAL_TABLET | Freq: Three times a day (TID) | ORAL | Status: DC | PRN

## 2022-04-04 MED ORDER — MENTHOL 3 MG MT LOZG: 1.0000 | LOZENGE | OROMUCOSAL | Status: DC | PRN

## 2022-04-04 MED ORDER — CHLORHEXIDINE GLUCONATE 0.12 % MT SOLN
OROMUCOSAL | Status: AC
  Administered 2022-04-04: 15 mL via OROMUCOSAL
  Filled 2022-04-04: qty 15

## 2022-04-04 MED ORDER — FENTANYL CITRATE (PF) 250 MCG/5ML IJ SOLN
INTRAMUSCULAR | Status: DC | PRN
  Administered 2022-04-04: 50 ug via INTRAVENOUS

## 2022-04-04 MED ORDER — MIDAZOLAM HCL 2 MG/2ML IJ SOLN
INTRAMUSCULAR | Status: AC
  Filled 2022-04-04: qty 2

## 2022-04-04 SURGICAL SUPPLY — 48 items
BAG COUNTER SPONGE SURGICOUNT (BAG) ×2 IMPLANT
BLADE CLIPPER SURG (BLADE) IMPLANT
BLADE SAW SGTL 18X1.27X75 (BLADE) ×2 IMPLANT
COVER SURGICAL LIGHT HANDLE (MISCELLANEOUS) ×2 IMPLANT
CUP SECTOR GRIPTON 50MM (Cup) ×1 IMPLANT
DRAPE C-ARM 42X72 X-RAY (DRAPES) ×2 IMPLANT
DRAPE STERI IOBAN 125X83 (DRAPES) ×2 IMPLANT
DRAPE U-SHAPE 47X51 STRL (DRAPES) ×6 IMPLANT
DRSG AQUACEL AG ADV 3.5X10 (GAUZE/BANDAGES/DRESSINGS) ×1 IMPLANT
DURAPREP 26ML APPLICATOR (WOUND CARE) ×2 IMPLANT
ELECT BLADE 4.0 EZ CLEAN MEGAD (MISCELLANEOUS) ×2
ELECT BLADE 6.5 EXT (BLADE) IMPLANT
ELECT REM PT RETURN 9FT ADLT (ELECTROSURGICAL) ×2
ELECTRODE BLDE 4.0 EZ CLN MEGD (MISCELLANEOUS) ×1 IMPLANT
ELECTRODE REM PT RTRN 9FT ADLT (ELECTROSURGICAL) ×1 IMPLANT
FACESHIELD WRAPAROUND (MASK) ×6 IMPLANT
FACESHIELD WRAPAROUND OR TEAM (MASK) ×2 IMPLANT
GLOVE BIOGEL PI IND STRL 8 (GLOVE) ×2 IMPLANT
GLOVE BIOGEL PI INDICATOR 8 (GLOVE) ×2
GLOVE ECLIPSE 8.0 STRL XLNG CF (GLOVE) ×2 IMPLANT
GLOVE ORTHO TXT STRL SZ7.5 (GLOVE) ×4 IMPLANT
GOWN STRL REUS W/ TWL LRG LVL3 (GOWN DISPOSABLE) ×2 IMPLANT
GOWN STRL REUS W/ TWL XL LVL3 (GOWN DISPOSABLE) ×2 IMPLANT
GOWN STRL REUS W/TWL LRG LVL3 (GOWN DISPOSABLE) ×4
GOWN STRL REUS W/TWL XL LVL3 (GOWN DISPOSABLE) ×4
HANDPIECE INTERPULSE COAX TIP (DISPOSABLE) ×2
HEAD FEM STD 32X+5 STRL (Hips) ×1 IMPLANT
KIT BASIN OR (CUSTOM PROCEDURE TRAY) ×2 IMPLANT
KIT TURNOVER KIT B (KITS) ×2 IMPLANT
LINER ACETABULAR 32X50 (Liner) ×1 IMPLANT
MANIFOLD NEPTUNE II (INSTRUMENTS) ×2 IMPLANT
NS IRRIG 1000ML POUR BTL (IV SOLUTION) ×2 IMPLANT
PACK TOTAL JOINT (CUSTOM PROCEDURE TRAY) ×2 IMPLANT
PAD ARMBOARD 7.5X6 YLW CONV (MISCELLANEOUS) ×2 IMPLANT
SET HNDPC FAN SPRY TIP SCT (DISPOSABLE) ×1 IMPLANT
STEM FEMORAL SZ5 HIGH ACTIS (Stem) ×1 IMPLANT
SUT ETHIBOND NAB CT1 #1 30IN (SUTURE) ×1 IMPLANT
SUT VIC AB 0 CT1 27 (SUTURE) ×2
SUT VIC AB 0 CT1 27XBRD ANBCTR (SUTURE) IMPLANT
SUT VIC AB 0 CT2 27 (SUTURE) ×1 IMPLANT
SUT VIC AB 1 CT1 27 (SUTURE) ×2
SUT VIC AB 1 CT1 27XBRD ANBCTR (SUTURE) IMPLANT
SUT VIC AB 2-0 CT1 27 (SUTURE) ×4
SUT VIC AB 2-0 CT1 TAPERPNT 27 (SUTURE) IMPLANT
TOWEL GREEN STERILE (TOWEL DISPOSABLE) ×2 IMPLANT
TOWEL GREEN STERILE FF (TOWEL DISPOSABLE) ×2 IMPLANT
TRAY FOLEY W/BAG SLVR 16FR (SET/KITS/TRAYS/PACK) ×2
TRAY FOLEY W/BAG SLVR 16FR ST (SET/KITS/TRAYS/PACK) IMPLANT

## 2022-04-04 NOTE — Op Note (Unsigned)
NAME: Seaborn, [place] RECORD NO: [id number] ACCOUNT NO: [id number] DATE OF BIRTH: Mar 19, 1951 FACILITY: [facility] LOCATION: [facility] PHYSICIAN: Lind Guest. Ninfa Linden, MD  Operative Report   DATE OF PROCEDURE: 04/04/2022  PREOPERATIVE DIAGNOSIS:  Left hip femoral neck fracture.  POSTOPERATIVE DIAGNOSIS:  Left hip femoral neck fracture.  PROCEDURE:  Left total hip arthroplasty through direct anterior approach.  IMPLANTS:  DePuy Sector Gription acetabular component size 50, size 32+0 neutral polyethylene liner, size 5 ACTIS femoral component with high offset, size 32+5 metal hip ball.  SURGEON:  Lind Guest. Ninfa Linden, MD  ASSISTANT:  Erskine Emery, PA-C  ANESTHESIA:  Spinal.  ANTIBIOTICS:  2 g IV Ancef.  BLOOD LOSS:  782 mL  COMPLICATIONS:  None.  INDICATIONS:  The patient is a very pleasant 71 year old community ambulator who does live alone.  She sustained a mechanical fall yesterday injuring her left hip.  She was seen last night at the [facility] and found to have a femoral neck  fracture.  She does have thin-appearing bone and was seen by one of my colleagues in town.  She is actually a patient of one of my other colleagues in town who has performed a knee replacement on her that has done very well.  She does live alone and  takes care of dogs.  Options were considering cannulated screw placement versus a total hip arthroplasty. Given the fact that she does live alone and ambulates regularly, we felt that faster recovery would be better with a total hip arthroplasty as well.   I think given her soft nature of bone that she would have a high risk of nonunion and she will need to be nonweightbearing for at least 4-6 weeks.  I did talk to her in detail and length about the different treatment options.  We discussed the risks  and benefits of surgery including the risk of acute blood loss anemia, nerve and vessel injury, fracture, infection, dislocation, implant  failure, leg length differences and skin and soft tissue issues.  She knows our goals are to decrease pain, improve  mobility and overall improve quality of life.  DESCRIPTION OF PROCEDURE:  After informed consent was obtained, appropriate left hip was marked.  She was brought to the operating room, sat up on the stretcher where spinal anesthesia was obtained.  She was laid in the supine position on the stretcher.   Foley catheter was placed and traction boots were placed on both her feet.  Next, she was placed supine on the Hana fracture table, the perineal post in place and both legs in line skeletal traction devices and no traction applied.  Her left operative  hip was prepped and draped with DuraPrep and sterile drapes.  A timeout was called.  She was identified correct patient, correct left hip.  I then made an incision just inferior and posterior to the anterior superior iliac spine and carried this  obliquely down the leg.  We dissected down tensor fascia lata muscle.  Tensor fascia was then divided longitudinally to proceed with direct anterior approach to the hip.  We identified and cauterized circumflex vessels and identified the hip capsule,  opened up the hip capsule finding hemarthrosis.  We could easily identify the femoral neck fracture.  It became displaced easily.  We then made a femoral neck cut just distal to the fracture and proximal to the lesser trochanter with an oscillating saw  and completed this with an osteotome.  We placed a corkscrew guide in  the femoral head and removed the femoral head in its entirety.  We then removed bony debris from within the acetabulum and removed remnants of acetabulum and other debris.  We then  placed a bent Hohmann over the medial acetabular rim and began reaming under direct visualization from a size 43 reamer in stepwise increments, going up to a size 49 reamer with all reamers placed under direct visualization, last reamer was placed under  direct  fluoroscopy, so we could obtain our depth of reaming, our inclination and anteversion.  I then placed real DePuy Sector Gription acetabular component size 50 and had a good fit to it.  We did this under fluoroscopy as well.  We placed a 32+0  neutral polyethylene liner based off offset.  Attention was then turned to the femur.  With the leg externally rotated to 120 degrees and extended and adducted, we were able to place a Mueller retractor medially and Hohmann retractor behind the greater  trochanter.  We released the lateral joint capsule and used a box cutting osteotome to enter femoral canal and a rongeur to lateralize, then began broaching using the ACTIS broaching system from a size 0 going to a size 5.  With a size 5 in place, we  trialed a standard offset femoral neck and a 32+1 hip ball and reduced this in the acetabulum.  Right away, we could see we needed more leg length and offset.  We dislocated the hip and removed the trial components.  We placed the real ACTIS femoral  component size 5 but went with high offset and we went with more length with 32+5 metal hip ball, reduced this in the acetabulum.  We were pleased with leg length, offset, range of motion and stability, assessed mechanically and radiographically.  We  then irrigated the soft tissue with normal saline solution using pulsatile lavage.  The joint capsule was closed with interrupted #1 Ethibond suture followed by #1 Vicryl to close the tensor fascia.  0 Vicryl was used to close the deep tissue and 2-0  Vicryl was used to close subcutaneous tissue.  The skin was closed with staples.  An Aquacel dressing was applied.  She was taken off the Hana table and taken to recovery room in stable condition with all final counts being correct.  No complications  noted.  Of note, Benita Stabile, PA-C, assisted during the entire case and assistance was crucial from beginning to end and medically necessary to complete this case including retraction of  soft tissues as well as helping guide implant placement and layered  closure of the wound.   SHW D: 04/04/2022 7:12:36 pm T: 04/04/2022 9:22:00 pm  JOB: [id number]/ [id number]

## 2022-04-04 NOTE — Brief Op Note (Signed)
04/03/2022 - 04/04/2022  7:14 PM  PATIENT:  Christina Garner  71 y.o. female  PRE-OPERATIVE DIAGNOSIS:  Left Hip Fx  POST-OPERATIVE DIAGNOSIS:  Left Hip Fx  PROCEDURE:  Procedure(s): TOTAL HIP ARTHROPLASTY ANTERIOR APPROACH (Left)  SURGEON:  Surgeon(s) and Role:    Mcarthur Rossetti, MD - Primary  PHYSICIAN ASSISTANT:  Benita Stabile, PA-C  ANESTHESIA:   spinal  EBL:  300 mL   COUNTS:  YES  TOURNIQUET:  * No tourniquets in log *  DICTATION: .Other Dictation: Dictation Number [id number]  PLAN OF CARE: Admit to inpatient   PATIENT DISPOSITION:  PACU - hemodynamically stable.   Delay start of Pharmacological VTE agent (>24hrs) due to surgical blood loss or risk of bleeding: no

## 2022-04-04 NOTE — Plan of Care (Signed)

## 2022-04-04 NOTE — Anesthesia Preprocedure Evaluation (Addendum)
Anesthesia Evaluation  Patient identified by MRN, date of birth, ID band Patient awake    Reviewed: Allergy & Precautions, NPO status , Patient's Chart, lab work & pertinent test results, reviewed documented beta blocker date and time   Airway Mallampati: II  TM Distance: >3 FB Neck ROM: Full    Dental  (+) Edentulous Upper, Missing, Dental Advisory Given   Pulmonary neg shortness of breath, asthma , neg sleep apnea, neg COPD, neg recent URI, former smoker,    breath sounds clear to auscultation       Cardiovascular hypertension, Pt. on medications and Pt. on home beta blockers (-) angina(-) Past MI and (-) CHF  Rhythm:Regular     Neuro/Psych  Headaches, PSYCHIATRIC DISORDERS Anxiety    GI/Hepatic Neg liver ROS, GERD  ,  Endo/Other  Hypothyroidism   Renal/GU CRFRenal diseaseLab Results      Component                Value               Date                      CREATININE               1.69 (H)            04/04/2022                Musculoskeletal  (+) Arthritis ,   Abdominal   Peds  Hematology  (+) Blood dyscrasia, anemia , Lab Results      Component                Value               Date                      WBC                      6.3                 04/04/2022                HGB                      11.8 (L)            04/04/2022                HCT                      35.6 (L)            04/04/2022                MCV                      91.5                04/04/2022                PLT                      152                 04/04/2022           \ Lab Results      Component  Value               Date                      INR                      1.1                 04/03/2022              Anesthesia Other Findings   Reproductive/Obstetrics                            Anesthesia Physical Anesthesia Plan  ASA: 3  Anesthesia Plan: MAC and Spinal   Post-op Pain  Management: Minimal or no pain anticipated   Induction: Intravenous  PONV Risk Score and Plan: Propofol infusion and Treatment may vary due to age or medical condition  Airway Management Planned: Nasal Cannula and Natural Airway  Additional Equipment: None  Intra-op Plan:   Post-operative Plan:   Informed Consent: I have reviewed the patients History and Physical, chart, labs and discussed the procedure including the risks, benefits and alternatives for the proposed anesthesia with the patient or authorized representative who has indicated his/her understanding and acceptance.     Dental advisory given  Plan Discussed with: CRNA  Anesthesia Plan Comments:         Anesthesia Quick Evaluation

## 2022-04-04 NOTE — Anesthesia Procedure Notes (Addendum)
Spinal  Patient location during procedure: OR Start time: 04/04/2022 5:41 PM End time: 04/04/2022 5:46 PM Reason for block: surgical anesthesia Staffing Performed: anesthesiologist  Anesthesiologist: Oleta Mouse, MD Performed by: Oleta Mouse, MD Authorized by: Oleta Mouse, MD   Preanesthetic Checklist Completed: patient identified, IV checked, site marked, risks and benefits discussed, surgical consent, monitors and equipment checked, pre-op evaluation and timeout performed Spinal Block Patient position: sitting Prep: DuraPrep Patient monitoring: heart rate, cardiac monitor, continuous pulse ox and blood pressure Approach: midline Location: L4-5 Injection technique: single-shot Needle Needle type: Pencan  Needle gauge: 24 G Needle length: 9 cm Assessment Sensory level: T6 Events: CSF return

## 2022-04-04 NOTE — Progress Notes (Signed)
Patient ID: Christina Garner, female   DOB: 06-18-1951, 71 y.o.   MRN: [id number] I came by the bedside to see the patient.  She is a very pleasant 71 year old who does live alone and takes care of her dogs.  She had a mechanical fall yesterday and was able to walk on her left hip but has significant pain.  Last evening she was seen at the [facility] emergency room and found to have a left hip femoral neck fracture.  Given the potential for this to displace further and given her high level of function, a total hip arthroplasty has been recommended.  One of my colleagues in town saw her last evening and that I was asked today could I address this surgically since I routinely perform a lot of hip replacements.  I talked to her in length in detail about this as well and she agrees to proceed to surgery.  I had a long and thorough discussion about the risk and benefits of surgery.  I talked about the potential for cannulated screw fixation versus a total hip arthroplasty.  I feel that she would have the most function and return to activities quickly with a direct anterior total hip arthroplasty.  She agrees with this as well.  She is also an active patient of one of my colleagues in town Dr. Augustin Coupe who performed a knee replacement on her in 2021 and she is very pleased with that knee replacement.  He is aware that she has a broken hip and is fine with me assuming the care for her hip.  Surgery will be later today.

## 2022-04-04 NOTE — ED Notes (Addendum)
Error in charting - disregard note about ambulating to restroom

## 2022-04-04 NOTE — Plan of Care (Signed)

## 2022-04-04 NOTE — Transfer of Care (Signed)
Immediate Anesthesia Transfer of Care Note  Patient: Christina Garner  Procedure(s) Performed: TOTAL HIP ARTHROPLASTY ANTERIOR APPROACH (Left: Hip)  Patient Location: PACU  Anesthesia Type:MAC and Spinal  Level of Consciousness: drowsy  Airway & Oxygen Therapy: Patient Spontanous Breathing and Patient connected to face mask oxygen  Post-op Assessment: Report given to RN and Post -op Vital signs reviewed and stable  Post vital signs: Reviewed and stable  Last Vitals:  Vitals Value Taken Time  BP 90/59 04/04/22 1940  Temp    Pulse 57 04/04/22 1941  Resp 15 04/04/22 1942  SpO2 100 % 04/04/22 1941  Vitals shown include unvalidated device data.  Last Pain:  Vitals:   04/04/22 1704  TempSrc:   PainSc: 0-No pain      Patients Stated Pain Goal: 0 (45/36/46 8032)  Complications: No notable events documented.

## 2022-04-04 NOTE — Hospital Course (Signed)
Christina Garner is a 71 y.o. female with medical history significant of laryngeal pharyngeal reflux, GERD, Barrett's esophagus, CKD, anxiety, hypothyroidism, hypertension, asthma, chronic pain, status post left total knee replacement presented to hospital with a fall and hip pain.  Denied loss of consciousness or head trauma.  She had initially gone to urgent care center at [facility].  X-ray showed minimally displaced left femoral neck fracture and patient was sent to [facility].  In the ED, patient had creatinine of 1.8 similar to previous, CBC within normal limits.  Chest x-ray showed no acute abnormality.  Orthopedics was consulted and patient was considered for admission to the hospital.  Assessment and plan  Closed displaced fracture of left femoral neck Minimally displaced left femoral neck fracture.  Patient underwent a total left hip arthroplasty on 04/04/2022.  Postoperative course was unremarkable.  Patient has been seen by physical therapy and no therapy needs have been identified on discharge.  At this time patient is stable for disposition home.  Continue aspirin for DVT prophylaxis.   GERD/LPR/Barrett's esophagus Continue omeprazole.  Mild AKI on CKD IIIa > Creatinine of 1.85 on admission  creatinine of around 1.2 in January as per the patient.  Received IV fluids.  Creatinine has come down to 1.3 today.  Currently at baseline.  Anxiety Continue Xanax and venlafaxine on discharge.  Hypothyroidism Continue Synthroid  Hypertension On losartan torsemide and atenolol at home.  Will be resumed on discharge   Tremor Continue atenolol from home.

## 2022-04-04 NOTE — Progress Notes (Signed)
Patient's cousin Dorian Pod called and updated.  Patient and cousin appreciative of the call.

## 2022-04-04 NOTE — Progress Notes (Signed)
PROGRESS NOTE    Christina Garner  QBH:[id number] DOB: April 16, 1951 DOA: 04/03/2022 PCP: Street, Sharon Mt, MD    Brief Narrative:  Christina Garner is a 71 y.o. female with medical history significant of laryngeal pharyngeal reflux, GERD, Barrett's esophagus, CKD, anxiety, hypothyroidism, hypertension, asthma, chronic pain, status post left total knee replacement presented to hospital with a fall and hip pain.  Denied loss of consciousness or head trauma.  She had initially gone to urgent care center at [facility].  X-ray showed minimally displaced left femoral neck fracture and patient was sent to [facility].  In the ED patient had creatinine of 1.8 similar to previous, CBC within normal limits.  Chest x-ray showed no acute abnormality.  Orthopedics was consulted and patient was considered for admission to the hospital.  Assessment and plan Principal Problem:   Closed displaced fracture of left femoral neck (HCC) Active Problems:   LPRD (laryngopharyngeal reflux disease)   GERD (gastroesophageal reflux disease)   Acute renal failure superimposed on chronic kidney disease (HCC)   HTN (hypertension)   Anxiety   Tremor   Hip fracture (HCC)   Closed displaced fracture of left femoral neck Minimally displaced left femoral neck fracture.  Currently NPO.  Orthopedics on board.  Plan for surgical intervention today.  Continue supportive care including analgesia.Marland Kitchen  GERD/LPR/Barrett's esophagus Continue PPI.  Mild AKI on CKD > Creatinine of 1.85 on admission  creatinine of around 1.2 in January as per the patient.  Received IV fluids.  Creatinine has come down to 1.6 today.  Nevertheless since the patient is n.p.o. for  Anxiety Continue Xanax and venlafaxine  Hypothyroidism Continue Synthroid  Hypertension Currently losartan and torsemide on hold due to AKI.  Continue atenolol   Tremor Continue atenolol from home.       DVT prophylaxis: SCDs Start: 04/03/22 1820   Code  Status:     Code Status: Full Code  Disposition: Home with home health/rehabilitation  Status is: Inpatient Remains inpatient appropriate because: Hip fracture requiring surgical intervention   Family Communication:  Communicated with the patient at bedside  Consultants:  Orthopedics  Procedures:  None yet  Antimicrobials:  Preoperative cefazolin  Anti-infectives (From admission, onward)    Start     Dose/Rate Route Frequency Ordered Stop   04/05/22 0600  ceFAZolin (ANCEF) IVPB 2g/100 mL premix        2 g 200 mL/hr over 30 Minutes Intravenous On call to O.R. 04/04/22 1340 04/06/22 0559        Subjective: Today, patient was seen and examined at bedside.  Denies overt pain except while moving the hip.  Denies shortness of breath nausea vomiting fever chills  Objective: Vitals:   04/04/22 1115 04/04/22 1130 04/04/22 1145 04/04/22 1200  BP: 129/74 129/90 130/88 133/63  Pulse: 66 64 63 62  Resp: '19 19 16 14  '$ Temp:      TempSrc:      SpO2: 99% 99% 100% 100%  Weight:      Height:        Intake/Output Summary (Last 24 hours) at 04/04/2022 1340 Last data filed at 04/04/2022 0124 Gross per 24 hour  Intake 375.34 ml  Output --  Net 375.34 ml   Filed Weights   04/03/22 1559  Weight: 99.3 kg    Physical Examination: Body mass index is 35.35 kg/m.  General: Obese built, not in obvious distress HENT:   No scleral pallor or icterus noted. Oral mucosa is moist.  Chest:  Clear breath sounds.  Diminished breath sounds bilaterally. No crackles or wheezes.  CVS: S1 &S2 heard. No murmur.  Regular rate and rhythm. Abdomen: Soft, nontender, nondistended.  Bowel sounds are heard.   Extremities: No cyanosis, clubbing or edema.  Peripheral pulses are palpable.  Left hip side tender on palpation Psych: Alert, awake and oriented, normal mood CNS:  No cranial nerve deficits.  Power equal in all extremities.   Skin: Warm and dry.  No rashes noted.  Data Reviewed:    CBC: Recent Labs  Lab 04/03/22 1718 04/04/22 0212  WBC 7.6 6.3  NEUTROABS 4.0  --   HGB 13.3 11.8*  HCT 40.5 35.6*  MCV 94.0 91.5  PLT 176 846    Basic Metabolic Panel: Recent Labs  Lab 04/03/22 1718 04/04/22 0212  NA 138 141  K 3.9 3.7  CL 106 108  CO2 20* 22  GLUCOSE 104* 111*  BUN 25* 25*  CREATININE 1.85* 1.69*  CALCIUM 9.1 8.6*    Liver Function Tests: No results for input(s): "AST", "ALT", "ALKPHOS", "BILITOT", "PROT", "ALBUMIN" in the last 168 hours.   Radiology Studies: DG Hip Unilat With Pelvis 2-3 Views Left  Result Date: 04/03/2022 CLINICAL DATA:  Fall. EXAM: DG HIP (WITH OR WITHOUT PELVIS) 2-3V LEFT COMPARISON:  Hip radiograph earlier today at [place]: Again seen acute minimally displaced femoral neck fracture. The femoral head remains seated. No change in alignment from prior exam. Pubic rami are intact. Pubic symphyseal chondrocalcinosis. IMPRESSION: Acute minimally displaced left femoral neck fracture, unchanged in alignment from earlier today. Electronically Signed   By: Keith Rake M.D.   On: 04/03/2022 16:56   DG Chest 2 View  Result Date: 04/03/2022 CLINICAL DATA:  Fall. EXAM: CHEST - 2 VIEW COMPARISON:  Chest x-ray 10/15/2015 FINDINGS: The heart size and mediastinal contours are within normal limits. Both lungs are clear. The visualized skeletal structures are unremarkable. IMPRESSION: No active cardiopulmonary disease. Electronically Signed   By: Ronney Asters M.D.   On: 04/03/2022 16:51      LOS: 0 days    Flora Lipps, MD [facility] Available via Epic secure chat 7am-7pm After these hours, please refer to coverage provider listed on [email or web address] 04/04/2022, 1:40 PM

## 2022-04-04 NOTE — TOC CAGE-AID Note (Signed)
Transition of Care [facility]) - CAGE-AID Screening   Patient Details  Name: Christina Garner MRN: [id number] Date of Birth: 1950-12-21  Transition of Care [facility]) CM/SW Contact:    Heba Ige C Tarpley-Carter, [place] Phone Number: 04/04/2022, 12:31 PM   Clinical Narrative: Pt participated in [place].  Pt stated she does not use substance or ETOH.  Pt was not offered resources, due to no usage of substance or ETOH.     Buffey Zabinski Tarpley-Carter, MSW, LCSW-A Pronouns:  She/Her/Hers [facility]itions of Care Clinical Social Worker Direct Number:  [phone] [email or web address]  CAGE-AID Screening:    Have You Ever Felt You Ought to Cut Down on Your Drinking or Drug Use?: No Have People Annoyed You By [employer] Your Drinking Or Drug Use?: No Have You Felt Bad Or Guilty About Your Drinking Or Drug Use?: No Have You Ever Had a Drink or Used Drugs First Thing In The Morning to Steady Your Nerves or to Get Rid of a Hangover?: No CAGE-AID Score: 0  Substance Abuse Education Offered: No

## 2022-04-04 NOTE — Consult Note (Signed)
Reason for Consult:Left hip fx Referring Physician: Corrie Mckusick Garner Time called: [id number] Time at bedside: 0846   Christina Garner is an 71 y.o. female.  HPI: Christina Garner was in her garage and fell. She's not sure what caused the fall but had immediate pain in her left hip on landing. She was able to get up and went to UC who referred her on to the ED. X-rays showed a left femoral neck fx and orthopedic surgery was consulted. She lives alone and does not use any assistive devices to ambulate.  Past Medical History:  Diagnosis Date   Acid reflux    Adult hypothyroidism    Anxiety    Arthritis    Asthma    Barrett's esophagus    Cancer ([place])    skin cancer on leg and back   Chronic kidney disease    GAD (generalized anxiety disorder)    GERD (gastroesophageal reflux disease)    Headache    High blood pressure    Hypothyroidism    Pneumonia    Pre-diabetes    Tremor     Past Surgical History:  Procedure Laterality Date   CHOLECYSTECTOMY  1998   COLONOSCOPY     ESOPHAGOGASTRODUODENOSCOPY ENDOSCOPY     TONSILLECTOMY     TOTAL KNEE ARTHROPLASTY Left 04/09/2020   Procedure: TOTAL KNEE ARTHROPLASTY;  Surgeon: Vickey Huger, MD;  Location: [facility];  Service: Orthopedics;  Laterality: Left;    Family History  Problem Relation Age of Onset   Asthma Mother    Diabetes Mother    Atrial fibrillation Mother    Heart Problems Mother    Lung cancer Father    Rheum arthritis Father    Parkinson's disease Father    Heart Problems Father    High Cholesterol Sister    Thyroid cancer Sister    Heart attack Brother    High Cholesterol Brother    Migraines Brother    High Cholesterol Sister    Migraines Sister     Social History:  reports that she quit smoking about 52 years ago. Her smoking use included cigarettes. She started smoking about 54 years ago. She has a 1.50 pack-year smoking history. She has never used smokeless tobacco. She reports that she does not drink alcohol and does not use  drugs.  Allergies:  Allergies  Allergen Reactions   Allopurinol Other (See Comments)    Kidney Failure   Gabapentin Nausea And Vomiting   Metformin And Related Other (See Comments)    Kidney Problems.   Penicillins     Rash.  TOLERATED ANCEF 04/09/2020.   Primidone Other (See Comments)    Made pt feel unlike herself     Medications: I have reviewed the patient's current medications.  Results for orders placed or performed during the hospital encounter of 04/03/22 (from the past 48 hour(s))  Basic metabolic panel     Status: Abnormal   Collection Time: 04/03/22  5:18 PM  Result Value Ref Range   Sodium 138 135 - 145 mmol/L   Potassium 3.9 3.5 - 5.1 mmol/L   Chloride 106 98 - 111 mmol/L   CO2 20 (L) 22 - 32 mmol/L   Glucose, Bld 104 (H) 70 - 99 mg/dL    Comment: Glucose reference range applies only to samples taken after fasting for at least 8 hours.   BUN 25 (H) 8 - 23 mg/dL   Creatinine, Ser 1.85 (H) 0.44 - 1.00 mg/dL   Calcium 9.1 8.9 - 10.3  mg/dL   GFR, Estimated 29 (L) >60 mL/min    Comment: (NOTE) Calculated using the CKD-EPI Creatinine Equation (2021)    Anion gap 12 5 - 15    Comment: Performed at [facility], [place] [address], [place], [place] [address]  CBC with Differential     Status: None   Collection Time: 04/03/22  5:18 PM  Result Value Ref Range   WBC 7.6 4.0 - 10.5 K/uL   RBC 4.31 3.87 - 5.11 MIL/uL   Hemoglobin 13.3 12.0 - 15.0 g/dL   HCT 40.5 36.0 - 46.0 %   MCV 94.0 80.0 - 100.0 fL   MCH 30.9 26.0 - 34.0 pg   MCHC 32.8 30.0 - 36.0 g/dL   RDW 13.0 11.5 - 15.5 %   Platelets 176 150 - 400 K/uL   nRBC 0.0 0.0 - 0.2 %   Neutrophils Relative % 53 %   Neutro Abs 4.0 1.7 - 7.7 K/uL   Lymphocytes Relative 31 %   Lymphs Abs 2.4 0.7 - 4.0 K/uL   Monocytes Relative 11 %   Monocytes Absolute 0.9 0.1 - 1.0 K/uL   Eosinophils Relative 4 %   Eosinophils Absolute 0.3 0.0 - 0.5 K/uL   Basophils Relative 1 %   Basophils Absolute 0.1 0.0 - 0.1 K/uL    Immature Granulocytes 0 %   Abs Immature Granulocytes 0.02 0.00 - 0.07 K/uL    Comment: Performed at [facility], [address], [place], [place] [address]  Protime-INR     Status: None   Collection Time: 04/03/22  5:18 PM  Result Value Ref Range   Prothrombin Time 13.9 11.4 - 15.2 seconds   INR 1.1 0.8 - 1.2    Comment: (NOTE) INR goal varies based on device and disease states. Performed at [facility], [place] [address] [place], [place] [address]   Type and screen [place]     Status: None   Collection Time: 04/03/22  5:18 PM  Result Value Ref Range   ABO/RH(D) O POS    Antibody Screen NEG    Sample Expiration      04/06/2022,2359 Performed at [facility], [place] [address] [place], [place] [address]   MRSA Next Gen by PCR, Nasal     Status: None   Collection Time: 04/04/22  1:26 AM   Specimen: Nasal Mucosa; Nasal Swab  Result Value Ref Range   MRSA by PCR Next Gen NOT DETECTED NOT DETECTED    Comment: (NOTE) The GeneXpert MRSA Assay (FDA approved for NASAL specimens only), is one component of a comprehensive MRSA colonization surveillance program. It is not intended to diagnose MRSA infection nor to guide or monitor treatment for MRSA infections. Test performance is not FDA approved in patients less than 27 years old. Performed at [facility], [place] [address], [place],  [address]   ABO/Rh     Status: None   Collection Time: 04/04/22  2:12 AM  Result Value Ref Range   ABO/RH(D)      O POS Performed at [place] [address], [place] [address]   CBC     Status: Abnormal   Collection Time: 04/04/22  2:12 AM  Result Value Ref Range   WBC 6.3 4.0 - 10.5 K/uL   RBC 3.89 3.87 - 5.11 MIL/uL   Hemoglobin 11.8 (L) 12.0 - 15.0 g/dL   HCT 35.6 (L) 36.0 - 46.0 %  MCV 91.5 80.0 - 100.0 fL   MCH 30.3 26.0 - 34.0 pg   MCHC 33.1 30.0 - 36.0 g/dL   RDW 13.2 11.5 - 15.5 %   Platelets 152 150 - 400 K/uL    nRBC 0.0 0.0 - 0.2 %    Comment: Performed at [facility], [place] [address] [place], [place] [address]  Basic metabolic panel     Status: Abnormal   Collection Time: 04/04/22  2:12 AM  Result Value Ref Range   Sodium 141 135 - 145 mmol/L   Potassium 3.7 3.5 - 5.1 mmol/L   Chloride 108 98 - 111 mmol/L   CO2 22 22 - 32 mmol/L   Glucose, Bld 111 (H) 70 - 99 mg/dL    Comment: Glucose reference range applies only to samples taken after fasting for at least 8 hours.   BUN 25 (H) 8 - 23 mg/dL   Creatinine, Ser 1.69 (H) 0.44 - 1.00 mg/dL   Calcium 8.6 (L) 8.9 - 10.3 mg/dL   GFR, Estimated 32 (L) >60 mL/min    Comment: (NOTE) Calculated using the CKD-EPI Creatinine Equation (2021)    Anion gap 11 5 - 15    Comment: Performed at [place] [address], [place], [place] [address]    DG Hip Unilat With Pelvis 2-3 Views Left  Result Date: 04/03/2022 CLINICAL DATA:  Fall. EXAM: DG HIP (WITH OR WITHOUT PELVIS) 2-3V LEFT COMPARISON:  Hip radiograph earlier today at [place]: Again seen acute minimally displaced femoral neck fracture. The femoral head remains seated. No change in alignment from prior exam. Pubic rami are intact. Pubic symphyseal chondrocalcinosis. IMPRESSION: Acute minimally displaced left femoral neck fracture, unchanged in alignment from earlier today. Electronically Signed   By: Keith Rake M.D.   On: 04/03/2022 16:56   DG Chest 2 View  Result Date: 04/03/2022 CLINICAL DATA:  Fall. EXAM: CHEST - 2 VIEW COMPARISON:  Chest x-ray 10/15/2015 FINDINGS: The heart size and mediastinal contours are within normal limits. Both lungs are clear. The visualized skeletal structures are unremarkable. IMPRESSION: No active cardiopulmonary disease. Electronically Signed   By: Ronney Asters M.D.   On: 04/03/2022 16:51    Review of Systems  HENT:  Negative for ear discharge, ear pain, hearing loss and tinnitus.   Eyes:  Negative for photophobia and pain.  Respiratory:   Negative for cough and shortness of breath.   Cardiovascular:  Negative for chest pain.  Gastrointestinal:  Negative for abdominal pain, nausea and vomiting.  Genitourinary:  Negative for dysuria, flank pain, frequency and urgency.  Musculoskeletal:  Positive for arthralgias (Left hip). Negative for back pain, myalgias and neck pain.  Neurological:  Negative for dizziness and headaches.  Hematological:  Does not bruise/bleed easily.  Psychiatric/Behavioral:  The patient is not nervous/anxious.    Blood pressure 130/60, pulse 66, temperature (!) 97.5 F (36.4 C), temperature source Oral, resp. rate 18, height '5\' 6"'$  (1.676 m), weight 99.3 kg, SpO2 96 %. Physical Exam Constitutional:      General: She is not in acute distress.    Appearance: She is well-developed. She is not diaphoretic.  HENT:     Head: Normocephalic and atraumatic.  Eyes:     General: No scleral icterus.       Right eye: No discharge.        Left eye: No discharge.     Conjunctiva/sclera: Conjunctivae normal.  Cardiovascular:     Rate and Rhythm: Normal  rate and regular rhythm.  Pulmonary:     Effort: Pulmonary effort is normal. No respiratory distress.  Musculoskeletal:     Cervical back: Normal range of motion.     Comments: LLE No traumatic wounds, ecchymosis, or rash  Minimal TTP hip  No knee or ankle effusion  Knee stable to varus/ valgus and anterior/posterior stress  Sens DPN, SPN, TN intact  Motor EHL, ext, flex, evers 5/5  DP 1+, PT 1+, No significant edema  Skin:    General: Skin is warm and dry.  Neurological:     Mental Status: She is alert.  Psychiatric:        Mood and Affect: Mood normal.        Behavior: Behavior normal.     Assessment/Plan: Left hip fx -- Plan THA today with Dr. Ninfa Linden. Please keep NPO.    Lisette Abu, PA-C Orthopedic Surgery [phone] 04/04/2022, 9:03 AM

## 2022-04-04 NOTE — Anesthesia Postprocedure Evaluation (Signed)
Anesthesia Post Note  Patient: Christina Garner  Procedure(s) Performed: TOTAL HIP ARTHROPLASTY ANTERIOR APPROACH (Left: Hip)     Patient location during evaluation: PACU Anesthesia Type: Spinal Level of consciousness: oriented and awake and alert Pain management: pain level controlled Vital Signs Assessment: post-procedure vital signs reviewed and stable Respiratory status: spontaneous breathing and respiratory function stable Cardiovascular status: blood pressure returned to baseline and stable Postop Assessment: no headache, no backache, no apparent nausea or vomiting, spinal receding and patient able to bend at knees Anesthetic complications: no   No notable events documented.  Last Vitals:  Vitals:   04/04/22 2045 04/04/22 2119  BP: (!) 126/92 115/62  Pulse: 66 65  Resp: 15 16  Temp: 36.7 C 36.6 C  SpO2: 97% 98%    Last Pain:  Vitals:   04/04/22 2119  TempSrc: Oral  PainSc: 0-No pain                 Irineo Gaulin,W. EDMOND

## 2022-04-05 DIAGNOSIS — S72002A Fracture of unspecified part of neck of left femur, initial encounter for closed fracture: Secondary | ICD-10-CM | POA: Diagnosis not present

## 2022-04-05 DIAGNOSIS — K219 Gastro-esophageal reflux disease without esophagitis: Secondary | ICD-10-CM | POA: Diagnosis not present

## 2022-04-05 DIAGNOSIS — F419 Anxiety disorder, unspecified: Secondary | ICD-10-CM | POA: Diagnosis not present

## 2022-04-05 DIAGNOSIS — N179 Acute kidney failure, unspecified: Secondary | ICD-10-CM | POA: Diagnosis not present

## 2022-04-05 DIAGNOSIS — N1831 Chronic kidney disease, stage 3a: Secondary | ICD-10-CM

## 2022-04-05 LAB — CBC
HCT: 32.5 % — ABNORMAL LOW (ref 36.0–46.0)
Hemoglobin: 10.8 g/dL — ABNORMAL LOW (ref 12.0–15.0)
MCH: 30.7 pg (ref 26.0–34.0)
MCHC: 33.2 g/dL (ref 30.0–36.0)
MCV: 92.3 fL (ref 80.0–100.0)
Platelets: 167 10*3/uL (ref 150–400)
RBC: 3.52 MIL/uL — ABNORMAL LOW (ref 3.87–5.11)
RDW: 13 % (ref 11.5–15.5)
WBC: 11.5 10*3/uL — ABNORMAL HIGH (ref 4.0–10.5)
nRBC: 0 % (ref 0.0–0.2)

## 2022-04-05 LAB — BASIC METABOLIC PANEL
Anion gap: 9 (ref 5–15)
BUN: 18 mg/dL (ref 8–23)
CO2: 18 mmol/L — ABNORMAL LOW (ref 22–32)
Calcium: 8.1 mg/dL — ABNORMAL LOW (ref 8.9–10.3)
Chloride: 108 mmol/L (ref 98–111)
Creatinine, Ser: 1.35 mg/dL — ABNORMAL HIGH (ref 0.44–1.00)
GFR, Estimated: 42 mL/min — ABNORMAL LOW (ref >60)
Glucose, Bld: 192 mg/dL — ABNORMAL HIGH (ref 70–99)
Potassium: 4.2 mmol/L (ref 3.5–5.1)
Sodium: 135 mmol/L (ref 135–145)

## 2022-04-05 LAB — MAGNESIUM: Magnesium: 1.4 mg/dL — ABNORMAL LOW (ref 1.7–2.4)

## 2022-04-05 MED ORDER — ASPIRIN 81 MG PO CHEW: 81.0000 mg | CHEWABLE_TABLET | Freq: Two times a day (BID) | ORAL | 0 refills | Status: DC

## 2022-04-05 MED ORDER — METHOCARBAMOL 500 MG PO TABS: 500.0000 mg | ORAL_TABLET | Freq: Four times a day (QID) | ORAL | 1 refills | Status: AC | PRN

## 2022-04-05 MED ORDER — MAGNESIUM OXIDE -MG SUPPLEMENT 400 (240 MG) MG PO TABS
400.0000 mg | ORAL_TABLET | Freq: Two times a day (BID) | ORAL | Status: DC
  Administered 2022-04-05: 400 mg via ORAL
  Filled 2022-04-05: qty 1

## 2022-04-05 NOTE — Discharge Summary (Signed)
Physician Discharge Summary   Patient: Christina Garner MRN: [id number] DOB: 13-Dec-1950  Admit date:     04/03/2022  Discharge date: 04/05/22  Discharge Physician: Flora Lipps   PCP: Street, Sharon [place], MD   Recommendations at discharge:   Follow-up with your primary care provider in 1 week.   Check CBC, BMP magnesium LFT in the next visit. Follow-up with Dr. Ninfa Linden, orthopedics in 2 weeks  Discharge Diagnoses: Active Problems:   LPRD (laryngopharyngeal reflux disease)   GERD (gastroesophageal reflux disease)   HTN (hypertension)   Anxiety   Tremor   Hip fracture (HCC)  Principal Problem (Resolved):   Closed displaced fracture of left femoral neck (HCC) Resolved Problems:   Acute renal failure superimposed on chronic kidney disease [facility])  Hospital Course: Christina Garner is a 71 y.o. female with medical history significant of laryngeal pharyngeal reflux, GERD, Barrett's esophagus, CKD, anxiety, hypothyroidism, hypertension, asthma, chronic pain, status post left total knee replacement presented to hospital with a fall and hip pain.  Denied loss of consciousness or head trauma.  She had initially gone to urgent care center at [facility].  X-ray showed minimally displaced left femoral neck fracture and patient was sent to [facility].  In the ED, patient had creatinine of 1.8 similar to previous, CBC within normal limits.  Chest x-ray showed no acute abnormality.  Orthopedics was consulted and patient was considered for admission to the hospital.  Assessment and plan  Closed displaced fracture of left femoral neck Minimally displaced left femoral neck fracture.  Patient underwent a total left hip arthroplasty on 04/04/2022.  Postoperative course was unremarkable.  Patient has been seen by physical therapy and no therapy needs have been identified on discharge.  At this time patient is stable for disposition home.  Continue aspirin for DVT prophylaxis.   GERD/LPR/Barrett's  esophagus Continue omeprazole.  Mild AKI on CKD IIIa > Creatinine of 1.85 on admission  creatinine of around 1.2 in January as per the patient.  Received IV fluids.  Creatinine has come down to 1.3 today.  Currently at baseline.  Anxiety Continue Xanax and venlafaxine on discharge.  Hypothyroidism Continue Synthroid  Hypertension On losartan torsemide and atenolol at home.  Will be resumed on discharge   Tremor Continue atenolol from home.  Consultants: Orthopedics Procedures performed: Total left hip arthroplasty on 04/04/2022 Disposition: Home Diet recommendation:  Discharge Diet Orders (From admission, onward)     Start     Ordered   04/05/22 0000  Diet general        04/05/22 0926           Regular diet DISCHARGE MEDICATION: Allergies as of 04/05/2022       Reactions   Allopurinol Other (See Comments)   Kidney Failure   Gabapentin Nausea And Vomiting   Metformin And Related Other (See Comments)   Kidney Problems.   Penicillins    Rash. TOLERATED ANCEF 04/09/2020.   Primidone Other (See Comments)   Made pt feel unlike herself         Medication List     STOP taking these medications    aspirin EC 325 MG tablet Replaced by: aspirin 81 MG chewable tablet   tiZANidine 4 MG tablet Commonly known as: ZANAFLEX       TAKE these medications    ALPRAZolam 1 MG tablet Commonly known as: XANAX Take 1 mg by mouth at bedtime.   aspirin 81 MG chewable tablet Chew 1 tablet (81 mg total)  by mouth 2 (two) times daily. Replaces: aspirin EC 325 MG tablet   atenolol 50 MG tablet Commonly known as: TENORMIN Take 50 mg by mouth 2 (two) times daily.   cyanocobalamin 1000 MCG/ML injection Commonly known as: (VITAMIN B-12) Inject 1,000 mcg into the muscle every 30 (thirty) days.   diclofenac Sodium 1 % Gel Commonly known as: VOLTAREN Apply 2 g topically 4 (four) times daily as needed (pain).   famotidine 20 MG tablet Commonly known as: PEPCID Take 40  mg by mouth at bedtime.   levothyroxine 150 MCG tablet Commonly known as: SYNTHROID Take 150 mcg by mouth daily before breakfast.   losartan 100 MG tablet Commonly known as: COZAAR Take 100 mg by mouth daily.   methocarbamol 500 MG tablet Commonly known as: ROBAXIN Take 1 tablet (500 mg total) by mouth every 6 (six) hours as needed for muscle spasms.   multivitamin with minerals tablet Take 1 tablet by mouth once a week.   naproxen 500 MG tablet Commonly known as: NAPROSYN Take 500 mg by mouth 2 (two) times daily.   omeprazole 40 MG capsule Commonly known as: PRILOSEC Take 1 capsule (40 mg total) by mouth 2 (two) times daily. 30MINS BEFORE BREAKFAST AND DINNER What changed:  when to take this additional instructions   torsemide 10 MG tablet Commonly known as: DEMADEX Take 10 mg by mouth daily as needed (swelling).   triamcinolone cream 0.1 % Commonly known as: KENALOG Apply 1 application topically daily as needed (irritation).   venlafaxine XR 150 MG 24 hr capsule Commonly known as: EFFEXOR-XR Take 150 mg by mouth daily.   venlafaxine XR 37.5 MG 24 hr capsule Commonly known as: EFFEXOR-XR Take 37.5 mg by mouth at bedtime.               Durable Medical Equipment  (From admission, onward)           Start     Ordered   04/04/22 2135  DME 3 n 1  Once        04/04/22 2135   04/04/22 2135  DME Walker rolling  Once       Question Answer Comment  Walker: With 5 Inch Wheels   Patient needs a walker to treat with the following condition Status post left hip replacement      04/04/22 2135            Follow-up Information     Mcarthur Rossetti, MD. Schedule an appointment as soon as possible for a visit in 2 week(s).   Specialty: Orthopedic Surgery Contact information: [address] [place] [address] [phone]                Subjective: Today, patient was seen and examined at bedside.  Feels okay.  Mild pain.  Has  ambulated with physical therapy.  No shortness of breath chest pain fever chills or rigor.  Wishes to go home.  Seen by orthopedics.  Discharge Exam: Filed Weights   04/03/22 1559 04/04/22 1644  Weight: 99.3 kg 99.3 kg      04/05/2022    7:29 AM 04/05/2022    5:05 AM 04/04/2022   11:00 PM  Vitals with BMI  Systolic 191 478 295  Diastolic 86 68 65  Pulse 64 64 65   Body mass index is 35.33 kg/m.   General: Obese built, not in obvious distress HENT:   No scleral pallor or icterus noted. Oral mucosa is moist.  Chest:  Clear breath sounds.  Diminished breath sounds bilaterally. No crackles or wheezes.  CVS: S1 &S2 heard. No murmur.  Regular rate and rhythm. Abdomen: Soft, nontender, nondistended.  Bowel sounds are heard.   Extremities: No cyanosis, clubbing or edema.  Peripheral pulses are palpable.  Left hip status post surgical intervention with dressing. Psych: Alert, awake and oriented, normal mood CNS:  No cranial nerve deficits.  Power equal in all extremities.   Skin: Warm and dry.   Condition at discharge: good  The results of significant diagnostics from this hospitalization (including imaging, microbiology, ancillary and laboratory) are listed below for reference.   Imaging Studies: DG Pelvis Portable  Result Date: 04/04/2022 CLINICAL DATA:  Left hip replacement EXAM: PORTABLE PELVIS 1-2 VIEWS COMPARISON:  None Available. FINDINGS: Orthopedic view of the pelvis demonstrates surgical changes of left total hip arthroplasty. Arthroplasty components overlie the expected position. Normal overall alignment. No unexpected fracture or dislocation. Mild right hip degenerative arthritis noted. Skin staples are seen lateral to the left hip. IMPRESSION: Status post left total hip arthroplasty. No unexpected fracture or dislocation. Electronically Signed   By: Fidela Salisbury M.D.   On: 04/04/2022 20:21   DG HIP UNILAT WITH PELVIS 2-3 VIEWS LEFT  Result Date: 04/04/2022 CLINICAL DATA:   Left total hip arthroplasty, anterior approach. EXAM: DG HIP (WITH OR WITHOUT PELVIS) 2-3V LEFT COMPARISON:  04/03/2022 FINDINGS: Intraoperative fluoroscopy is utilized for surgical control purposes. Fluoroscopy time is indicated at 22.3 seconds. Dose, cumulative air kerma was 2.6294 mGy. Three spot fluoroscopic images were obtained. Spot fluoroscopic images obtained demonstrate interval placement of a left total hip arthroplasty using non cemented components. Components appear well seated. IMPRESSION: Intraoperative fluoroscopy utilized for surgical control purposes demonstrating placement of a left total hip arthroplasty. Electronically Signed   By: Lucienne Capers M.D.   On: 04/04/2022 19:49   DG C-Arm 1-60 Min-No Report  Result Date: 04/04/2022 Fluoroscopy was utilized by the requesting physician.  No radiographic interpretation.   DG Hip Unilat With Pelvis 2-3 Views Left  Result Date: 04/03/2022 CLINICAL DATA:  Fall. EXAM: DG HIP (WITH OR WITHOUT PELVIS) 2-3V LEFT COMPARISON:  Hip radiograph earlier today at [facility]: Again seen acute minimally displaced femoral neck fracture. The femoral head remains seated. No change in alignment from prior exam. Pubic rami are intact. Pubic symphyseal chondrocalcinosis. IMPRESSION: Acute minimally displaced left femoral neck fracture, unchanged in alignment from earlier today. Electronically Signed   By: Keith Rake M.D.   On: 04/03/2022 16:56   DG Chest 2 View  Result Date: 04/03/2022 CLINICAL DATA:  Fall. EXAM: CHEST - 2 VIEW COMPARISON:  Chest x-ray 10/15/2015 FINDINGS: The heart size and mediastinal contours are within normal limits. Both lungs are clear. The visualized skeletal structures are unremarkable. IMPRESSION: No active cardiopulmonary disease. Electronically Signed   By: Ronney Asters M.D.   On: 04/03/2022 16:51    Microbiology: Results for orders placed or performed during the hospital encounter of 04/03/22  MRSA Next Gen by PCR,  Nasal     Status: None   Collection Time: 04/04/22  1:26 AM   Specimen: Nasal Mucosa; Nasal Swab  Result Value Ref Range Status   MRSA by PCR Next Gen NOT DETECTED NOT DETECTED Final    Comment: (NOTE) The GeneXpert MRSA Assay (FDA approved for NASAL specimens only), is one component of a comprehensive MRSA colonization surveillance program. It is not intended to diagnose MRSA infection nor to guide or monitor treatment for MRSA infections. Test  performance is not FDA approved in patients less than 70 years old. Performed at [facility], [place] [address] [place], [place] [address]     Labs: CBC: Recent Labs  Lab 04/03/22 1718 04/04/22 0212 04/05/22 0138  WBC 7.6 6.3 11.5*  NEUTROABS 4.0  --   --   HGB 13.3 11.8* 10.8*  HCT 40.5 35.6* 32.5*  MCV 94.0 91.5 92.3  PLT 176 152 600   Basic Metabolic Panel: Recent Labs  Lab 04/03/22 1718 04/04/22 0212 04/05/22 0138  NA 138 141 135  K 3.9 3.7 4.2  CL 106 108 108  CO2 20* 22 18*  GLUCOSE 104* 111* 192*  BUN 25* 25* 18  CREATININE 1.85* 1.69* 1.35*  CALCIUM 9.1 8.6* 8.1*  MG  --   --  1.4*   Liver Function Tests: No results for input(s): "AST", "ALT", "ALKPHOS", "BILITOT", "PROT", "ALBUMIN" in the last 168 hours. CBG: No results for input(s): "GLUCAP" in the last 168 hours.  Discharge time spent: greater than 30 minutes.  Signed: Flora Lipps, MD [facility] 04/05/2022

## 2022-04-05 NOTE — Evaluation (Signed)
Physical Therapy Evaluation Patient Details Name: Christina Garner MRN: [id number] DOB: Sep 05, 1951 Today's Date: 04/05/2022  History of Present Illness  Pt is a 71 y.o. F who presents after a fall with a closed displaced fracture of left femoral neck s/p L THA (direct anterior) 04/04/2022. Significant PMH: CKD, asthma, L TKA.  Clinical Impression  Pt admitted s/p procedure listed above. Pt verbalizing excellent pain control and is overall mobilizing well. Ambulating 100 ft with a walker and negotiated 3 steps with railings modI. Performed and reviewed written HEP for strengthening and ROM. Discussed appropriate activity recommendation and cryotherapy. Pt reports she has family assisting with her pets. No PT follow up anticipated.      Recommendations for follow up therapy are one component of a multi-disciplinary discharge planning process, led by the attending physician.  Recommendations may be updated based on patient status, additional functional criteria and insurance authorization.  Follow Up Recommendations No PT follow up    Assistance Recommended at Discharge PRN  Patient can return home with the following  Assistance with cooking/housework;Assist for transportation    Equipment Recommendations None recommended by PT (pt has needed DME)  Recommendations for Other Services       Functional Status Assessment Patient has had a recent decline in their functional status and demonstrates the ability to make significant improvements in function in a reasonable and predictable amount of time.     Precautions / Restrictions Precautions Precautions: Fall Restrictions Weight Bearing Restrictions: Yes LLE Weight Bearing: Weight bearing as tolerated      Mobility  Bed Mobility Overal bed mobility: Modified Independent             General bed mobility comments: Increased time to progress LLE off edge of bed    Transfers Overall transfer level: Modified independent Equipment  used: Rolling walker (2 wheels)               General transfer comment: elevated surface    Ambulation/Gait Ambulation/Gait assistance: Modified independent (Device/Increase time) Gait Distance (Feet): 100 Feet Assistive device: Rolling walker (2 wheels) Gait Pattern/deviations: Step-through pattern, Decreased stance time - left, Decreased weight shift to left       General Gait Details: Step through pattern, equal step lengths, modI  Stairs Stairs: Yes Stairs assistance: Modified independent (Device/Increase time) Stair Management: Two rails Number of Stairs: 3 General stair comments: cues for sequencing/technique  Wheelchair Mobility    Modified Rankin (Stroke Patients Only)       Balance Overall balance assessment: Needs assistance Sitting-balance support: Feet supported Sitting balance-Leahy Scale: Good     Standing balance support: No upper extremity supported, During functional activity Standing balance-Leahy Scale: Fair Standing balance comment: washing hands at sink without physical assist                             Pertinent Vitals/Pain Pain Assessment Pain Assessment: Faces Faces Pain Scale: Hurts a little bit Pain Location: L hip Pain Descriptors / Indicators: Operative site guarding, Sore Pain Intervention(s): Limited activity within patient's tolerance, Monitored during session, Patient requesting pain meds-RN notified    Home Living Family/patient expects to be discharged to:: Private residence Living Arrangements: Alone Available Help at Discharge: Family Type of Home: House Home Access: Stairs to enter Entrance Stairs-Rails: Chemical engineer of Steps: 2   Home Layout: One level Home Equipment: Conservation officer, nature (2 wheels);Cane - single point;Shower seat Additional Comments: Has 2 dogs and 2  cats    Prior Function Prior Level of Function : Independent/Modified Independent                     Hand  Dominance        Extremity/Trunk Assessment   Upper Extremity Assessment Upper Extremity Assessment: Overall WFL for tasks assessed    Lower Extremity Assessment Lower Extremity Assessment: LLE deficits/detail LLE Deficits / Details: s/p THA. Grossly 3-/5 strength       Communication   Communication: No difficulties  Cognition Arousal/Alertness: Awake/alert Behavior During Therapy: WFL for tasks assessed/performed Overall Cognitive Status: Within Functional Limits for tasks assessed                                          General Comments      Exercises Total Joint Exercises Quad Sets: Both, 10 reps, Supine Short Arc Quad: Left, 10 reps, Supine Heel Slides: Left, 10 reps, Supine Hip ABduction/ADduction: Left, 10 reps, Supine   Assessment/Plan    PT Assessment Patient needs continued PT services  PT Problem List Decreased strength;Decreased activity tolerance;Decreased balance;Decreased mobility;Pain       PT Treatment Interventions DME instruction;Gait training;Stair training;Functional mobility training;Therapeutic activities;Therapeutic exercise;Balance training;Patient/family education    PT Goals (Current goals can be found in the Care Plan section)  Acute Rehab PT Goals Patient Stated Goal: go home to pets PT Goal Formulation: With patient Time For Goal Achievement: 04/19/22 Potential to Achieve Goals: Good    Frequency Min 5X/week     Co-evaluation               AM-PAC PT "6 Clicks" Mobility  Outcome Measure Help needed turning from your back to your side while in a flat bed without using bedrails?: None Help needed moving from lying on your back to sitting on the side of a flat bed without using bedrails?: None Help needed moving to and from a bed to a chair (including a wheelchair)?: None Help needed standing up from a chair using your arms (e.g., wheelchair or bedside chair)?: None Help needed to walk in hospital room?:  None Help needed climbing 3-5 steps with a railing? : None 6 Click Score: 24    End of Session   Activity Tolerance: Patient tolerated treatment well Patient left: in chair;with call bell/phone within reach;with chair alarm set Nurse Communication: Mobility status PT Visit Diagnosis: Other abnormalities of gait and mobility (R26.89);Difficulty in walking, not elsewhere classified (R26.2);Pain Pain - Right/Left: Left Pain - part of body: Hip    Time: 7939-0300 PT Time Calculation (min) (ACUTE ONLY): 27 min   Charges:   PT Evaluation $PT Eval Low Complexity: 1 Low PT Treatments $Gait Training: 8-22 mins        Wyona Almas, PT, DPT Acute Rehabilitation Services Office [phone]   Deno Etienne 04/05/2022, 10:47 AM

## 2022-04-05 NOTE — Progress Notes (Signed)
Subjective: 1 Day Post-Op Procedure(s) (LRB): TOTAL HIP ARTHROPLASTY ANTERIOR APPROACH (Left) Patient reports pain as mild.  She tolerated surgery very well last evening.  She has already been up ambulating with nursing.  She understands it would be nice to have physical therapy today.  She does have minimal acute blood loss anemia from her surgery but her vital signs are stable and she is asymptomatic.  Objective: Vital signs in last 24 hours: Temp:  [97.6 F (36.4 C)-98.5 F (36.9 C)] 98.5 F (36.9 C) (06/17 0729) Pulse Rate:  [56-66] 64 (06/17 0729) Resp:  [13-20] 15 (06/17 0505) BP: (90-154)/(57-92) 125/86 (06/17 0729) SpO2:  [94 %-100 %] 96 % (06/17 0729) Weight:  [99.3 kg] 99.3 kg (06/16 1644)  Intake/Output from previous day: 06/16 0701 - 06/17 0700 In: 1000 [I.V.:800; IV Piggyback:200] Out: 500 [Urine:200; Blood:300] Intake/Output this shift: No intake/output data recorded.  Recent Labs    04/03/22 1718 04/04/22 0212 04/05/22 0138  HGB 13.3 11.8* 10.8*   Recent Labs    04/04/22 0212 04/05/22 0138  WBC 6.3 11.5*  RBC 3.89 3.52*  HCT 35.6* 32.5*  PLT 152 167   Recent Labs    04/04/22 0212 04/05/22 0138  NA 141 135  K 3.7 4.2  CL 108 108  CO2 22 18*  BUN 25* 18  CREATININE 1.69* 1.35*  GLUCOSE 111* 192*  CALCIUM 8.6* 8.1*   Recent Labs    04/03/22 1718  INR 1.1    Sensation intact distally Intact pulses distally Dorsiflexion/Plantar flexion intact Incision: scant drainage   Assessment/Plan: 1 Day Post-Op Procedure(s) (LRB): TOTAL HIP ARTHROPLASTY ANTERIOR APPROACH (Left) Up with therapy Discharge home with home health hopefully today once she is seen by the hospitalist service. She understands that I am recommending an 81 mg aspirin twice a day.  This is for DVT prophylaxis. She does have pain medication at home so does not need any type of pain medication but I will send in a muscle relaxant to her pharmacy.      Mcarthur Rossetti 04/05/2022, 7:48 AM

## 2022-04-05 NOTE — Discharge Instructions (Signed)

## 2022-04-07 ENCOUNTER — Encounter (HOSPITAL_COMMUNITY): Payer: Self-pay | Admitting: Orthopaedic Surgery

## 2022-04-07 MED ORDER — DEXAMETHASONE SODIUM PHOSPHATE 10 MG/ML IJ SOLN
INTRAMUSCULAR | Status: DC | PRN
  Administered 2022-04-04: 5 mg

## 2022-04-07 MED ORDER — ROPIVACAINE HCL 5 MG/ML IJ SOLN
INTRAMUSCULAR | Status: DC | PRN
  Administered 2022-04-04: 30 mL via PERINEURAL

## 2022-04-07 NOTE — Anesthesia Pain Management Evaluation Note (Signed)
  Anesthesia Pain Consult Note  Patient: Christina Garner Height, 71 y.o., female  Consult Requested by: No att. providers found  Reason for Consult: left hip fracture  Level of Consciousness: alert  Pain: mild   Last Vitals: There were no vitals filed for this visit.  Plan: Peripheral nerve block for pain control  Risks of wet tap, epidural hematoma and spinal cord injury explained to:   Consent:Risks of procedure as well as the alternatives and risks of each were explained to the (patient/caregiver).  Consent for procedure obtained.  Advance Directive:Patient did not wish to name a surrogate decision maker or provide an advance care plan.   Allergies  Allergen Reactions  . Allopurinol Other (See Comments)    Kidney Failure  . Gabapentin Nausea And Vomiting  . Metformin And Related Other (See Comments)    Kidney Problems.  . Penicillins     Rash.  TOLERATED ANCEF 04/09/2020.  Marland Kitchen Primidone Other (See Comments)    Made pt feel unlike herself     Physical exam: PULM normal and clear to auscultation  CARDIO Heart sounds are normal.  Regular rate and rhythm without murmur, gallop or rub.  OTHER    I have reviewed the patient's medications listed below.     Past Medical History:  Diagnosis Date  . Acid reflux   . Adult hypothyroidism   . Anxiety   . Arthritis   . Asthma   . Barrett's esophagus   . Cancer ([place])    skin cancer on leg and back  . Chronic kidney disease   . GAD (generalized anxiety disorder)   . GERD (gastroesophageal reflux disease)   . Headache   . High blood pressure   . Hypothyroidism   . Pneumonia   . Pre-diabetes   . Tremor    Past Surgical History:  Procedure Laterality Date  . CHOLECYSTECTOMY  1998  . COLONOSCOPY    . ESOPHAGOGASTRODUODENOSCOPY ENDOSCOPY    . TONSILLECTOMY    . TOTAL KNEE ARTHROPLASTY Left 04/09/2020   Procedure: TOTAL KNEE ARTHROPLASTY;  Surgeon: Vickey Huger, MD;  Location: [facility];  Service: Orthopedics;  Laterality:  Left;    reports that she quit smoking about 52 years ago. Her smoking use included cigarettes. She started smoking about 54 years ago. She has a 1.50 pack-year smoking history. She has never used smokeless tobacco. She reports that she does not drink alcohol and does not use drugs.    Farin Buhman L Amsi Grimley 04/07/2022

## 2022-04-07 NOTE — Anesthesia Procedure Notes (Signed)
Anesthesia Regional Block: Peng block   Pre-Anesthetic Checklist: , timeout performed,  Correct Patient, Correct Site, Correct Laterality,  Correct Procedure, Correct Position, site marked,  Risks and benefits discussed,  Pre-op evaluation,  At surgeon's request and post-op pain management  Laterality: Left  Prep: Maximum Sterile Barrier Precautions used, chloraprep       Needles:  Injection technique: Single-shot  Needle Type: Echogenic Stimulator Needle     Needle Length: 9cm  Needle Gauge: 21     Additional Needles:   Procedures:,,,, ultrasound used (permanent image in chart),,    Narrative:  Start time: 04/04/2022 11:20 AM End time: 04/04/2022 11:25 AM Injection made incrementally with aspirations every 5 mL. Anesthesiologist: Freddrick March, MD

## 2022-04-12 ENCOUNTER — Other Ambulatory Visit: Payer: Self-pay | Admitting: Orthopaedic Surgery

## 2022-04-23 ENCOUNTER — Ambulatory Visit (INDEPENDENT_AMBULATORY_CARE_PROVIDER_SITE_OTHER): Payer: Medicare Other | Admitting: Physician Assistant

## 2022-04-23 ENCOUNTER — Encounter: Payer: Self-pay | Admitting: Physician Assistant

## 2022-04-23 DIAGNOSIS — Z96642 Presence of left artificial hip joint: Secondary | ICD-10-CM

## 2022-04-23 MED ORDER — DOXYCYCLINE HYCLATE 100 MG PO TABS: 100.0000 mg | ORAL_TABLET | Freq: Two times a day (BID) | ORAL | 0 refills | Status: AC

## 2022-04-23 NOTE — Progress Notes (Signed)
HPI: Ms. Christina Garner returns today status post left total hip arthroplasty 04/04/2022.  This was performed due to a left femoral neck fracture.  She states overall she is doing good.  She is taking no pain medications denies any fevers chills.  She is taking aspirin 81 mg twice daily for DVT prophylaxis.  She is ambulating with a cane.  She does have some discomfort in her left knee.  History of left total knee arthroplasty.  Review of systems: See HPI  Physical exam: General well-developed well-nourished female who ambulates with a cane in no acute distress.  Left hip surgical incisions well approximated with staples.  Erythema proximal incision no drainage.  Dorsiflexion plantarflexion left ankle intact.  Good range of motion left knee without significant pain.  Impression: Status post left total hip arthroplasty  Plan: Staples removed Steri-Strips applied.  She will wash the incision with antibacterial soap daily and dry completely.  Then place a gauze in the proximal portion of the incision.  Did place her on doxycycline 100 mg twice daily for 14 days.  She will follow-up with [place] in a week sooner if there is any drainage or signs of infection about the left hip.  Questions were encouraged and answered.  She will take an 81 mg aspirin once daily for another week and then discontinue as she was on no aspirin prior to surgery

## 2022-04-28 ENCOUNTER — Other Ambulatory Visit: Payer: Self-pay | Admitting: Orthopaedic Surgery

## 2022-04-30 ENCOUNTER — Encounter: Payer: Self-pay | Admitting: Orthopaedic Surgery

## 2022-04-30 ENCOUNTER — Ambulatory Visit (INDEPENDENT_AMBULATORY_CARE_PROVIDER_SITE_OTHER): Payer: Medicare Other | Admitting: Orthopaedic Surgery

## 2022-04-30 DIAGNOSIS — Z96642 Presence of left artificial hip joint: Secondary | ICD-10-CM

## 2022-04-30 NOTE — Progress Notes (Signed)
The patient is 2-1/2 weeks status post a left total hip arthroplasty to treat an acute femoral neck fracture.  She is walking without assistive device and denies any significant pain.  She says she just a little sore.  When she saw Artis Delay last week at her first postoperative visit, staples were removed.  There is just a little bit of redness at the top of the incision so we will put her appropriately on doxycycline and brought her back at 1 week just to make sure everything was doing well.  Incision looks good.  Remove the remainder of the Steri-Strips.  There is a moderate seroma so was able to aspirate about 45 to 50 cc of fluid from around the hip area.  There is no redness and no breakdown.  I would like to see her back in 3 weeks to see how she is doing overall.  No x-rays are needed.  If there are issues with her incision prior to then she will let us know.  She will continue 1 more week of doxycycline.

## 2022-05-13 DIAGNOSIS — M1A0720 Idiopathic chronic gout, left ankle and foot, without tophus (tophi): Secondary | ICD-10-CM | POA: Diagnosis not present

## 2022-05-13 DIAGNOSIS — E785 Hyperlipidemia, unspecified: Secondary | ICD-10-CM | POA: Diagnosis not present

## 2022-05-13 DIAGNOSIS — R7301 Impaired fasting glucose: Secondary | ICD-10-CM | POA: Diagnosis not present

## 2022-05-13 DIAGNOSIS — Z79899 Other long term (current) drug therapy: Secondary | ICD-10-CM | POA: Diagnosis not present

## 2022-05-13 DIAGNOSIS — D6489 Other specified anemias: Secondary | ICD-10-CM | POA: Diagnosis not present

## 2022-05-21 ENCOUNTER — Encounter: Payer: Medicare Other | Admitting: Orthopaedic Surgery

## 2022-05-27 ENCOUNTER — Encounter: Payer: Medicare Other | Admitting: Physician Assistant

## 2022-06-11 ENCOUNTER — Ambulatory Visit (INDEPENDENT_AMBULATORY_CARE_PROVIDER_SITE_OTHER): Payer: Medicare Other | Admitting: Orthopaedic Surgery

## 2022-06-11 ENCOUNTER — Encounter: Payer: Self-pay | Admitting: Orthopaedic Surgery

## 2022-06-11 DIAGNOSIS — Z96642 Presence of left artificial hip joint: Secondary | ICD-10-CM

## 2022-06-11 NOTE — Progress Notes (Signed)
The patient is a pleasant 71 year old female who is over 2 months out from a left total hip arthroplasty that was needed to treat an acute displaced femoral neck fracture.  She said the hip is doing great.  One of my colleagues in town Dr. Lorre Nick did replace her left knee remotely and that is done well.  She has no issues.  However, she was recently bitten by a cat on her right hand.  She is been on antibiotics for that and said things are finally calming down.  Her left operative hip moves smoothly and fluidly with no issues at all.  Incision looks great.  Her leg lengths are equal.  She is walking with no significant limp.  At this point for hip I do not need to see her back for 6 months unless there is issues.  We will have a standing low view pelvis and a lateral of the left hip at that time.  If there are issues as it relates to the cat bite on her hand she will let us know.  She said she would like [place] to see her for her right knee at some point.  We can always x-ray her right knee at her next visit if it is bothering her.  All questions and concerns were answered and addressed.

## 2022-07-04 DIAGNOSIS — D519 Vitamin B12 deficiency anemia, unspecified: Secondary | ICD-10-CM | POA: Diagnosis not present

## 2022-07-09 DIAGNOSIS — H5203 Hypermetropia, bilateral: Secondary | ICD-10-CM | POA: Diagnosis not present

## 2022-07-11 DIAGNOSIS — H5203 Hypermetropia, bilateral: Secondary | ICD-10-CM | POA: Diagnosis not present

## 2022-07-25 DIAGNOSIS — E1169 Type 2 diabetes mellitus with other specified complication: Secondary | ICD-10-CM | POA: Diagnosis not present

## 2022-07-25 DIAGNOSIS — E781 Pure hyperglyceridemia: Secondary | ICD-10-CM | POA: Diagnosis not present

## 2022-07-25 DIAGNOSIS — M1991 Primary osteoarthritis, unspecified site: Secondary | ICD-10-CM | POA: Diagnosis not present

## 2022-07-25 DIAGNOSIS — S4992XD Unspecified injury of left shoulder and upper arm, subsequent encounter: Secondary | ICD-10-CM | POA: Diagnosis not present

## 2022-08-12 DIAGNOSIS — S4992XD Unspecified injury of left shoulder and upper arm, subsequent encounter: Secondary | ICD-10-CM | POA: Diagnosis not present

## 2022-08-19 DIAGNOSIS — R296 Repeated falls: Secondary | ICD-10-CM | POA: Diagnosis not present

## 2022-08-21 DIAGNOSIS — Z23 Encounter for immunization: Secondary | ICD-10-CM | POA: Diagnosis not present

## 2022-09-10 DIAGNOSIS — R509 Fever, unspecified: Secondary | ICD-10-CM | POA: Diagnosis not present

## 2022-10-22 IMAGING — DX DG HIP (WITH OR WITHOUT PELVIS) 2-3V*L*
3 series · 3 of 3 positions shown · non-contrast
Comparison: Hip radiograph earlier today at [facility]

CLINICAL DATA: Fall.

EXAM:
DG HIP (WITH OR WITHOUT PELVIS) 2-3V LEFT

[pelvis ap]
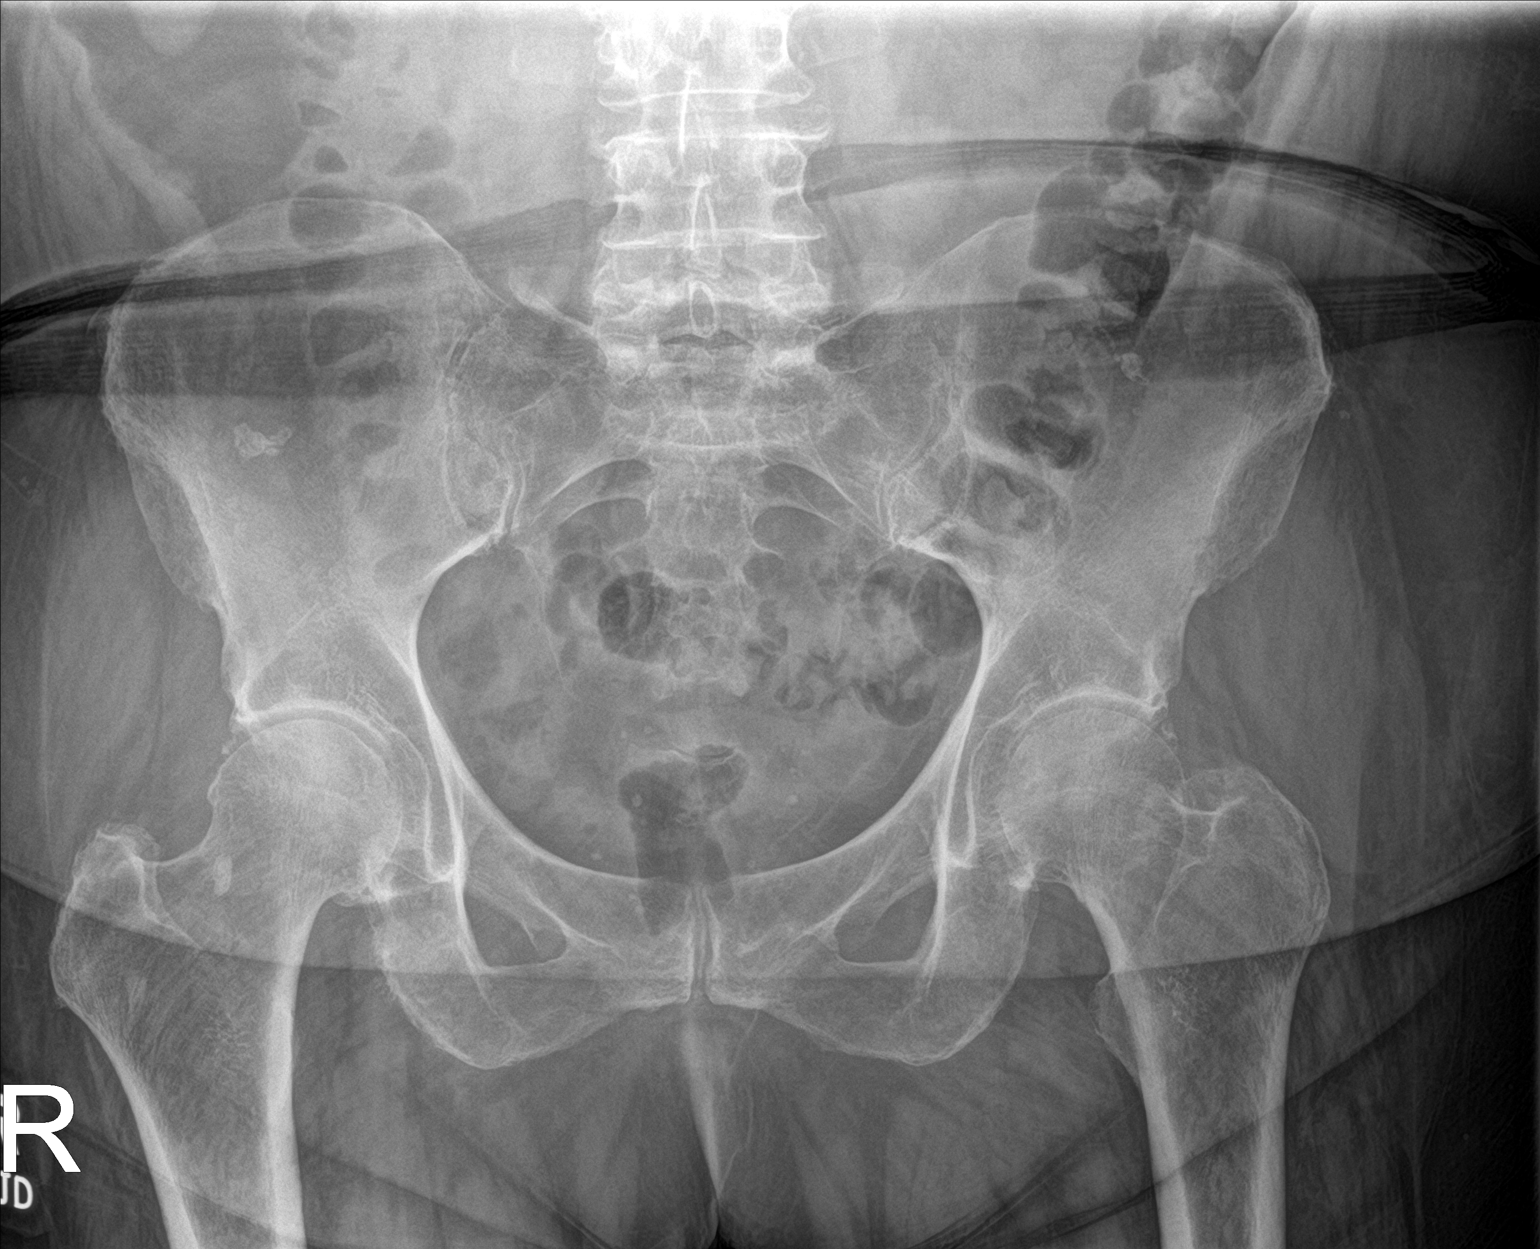

[hip ap]
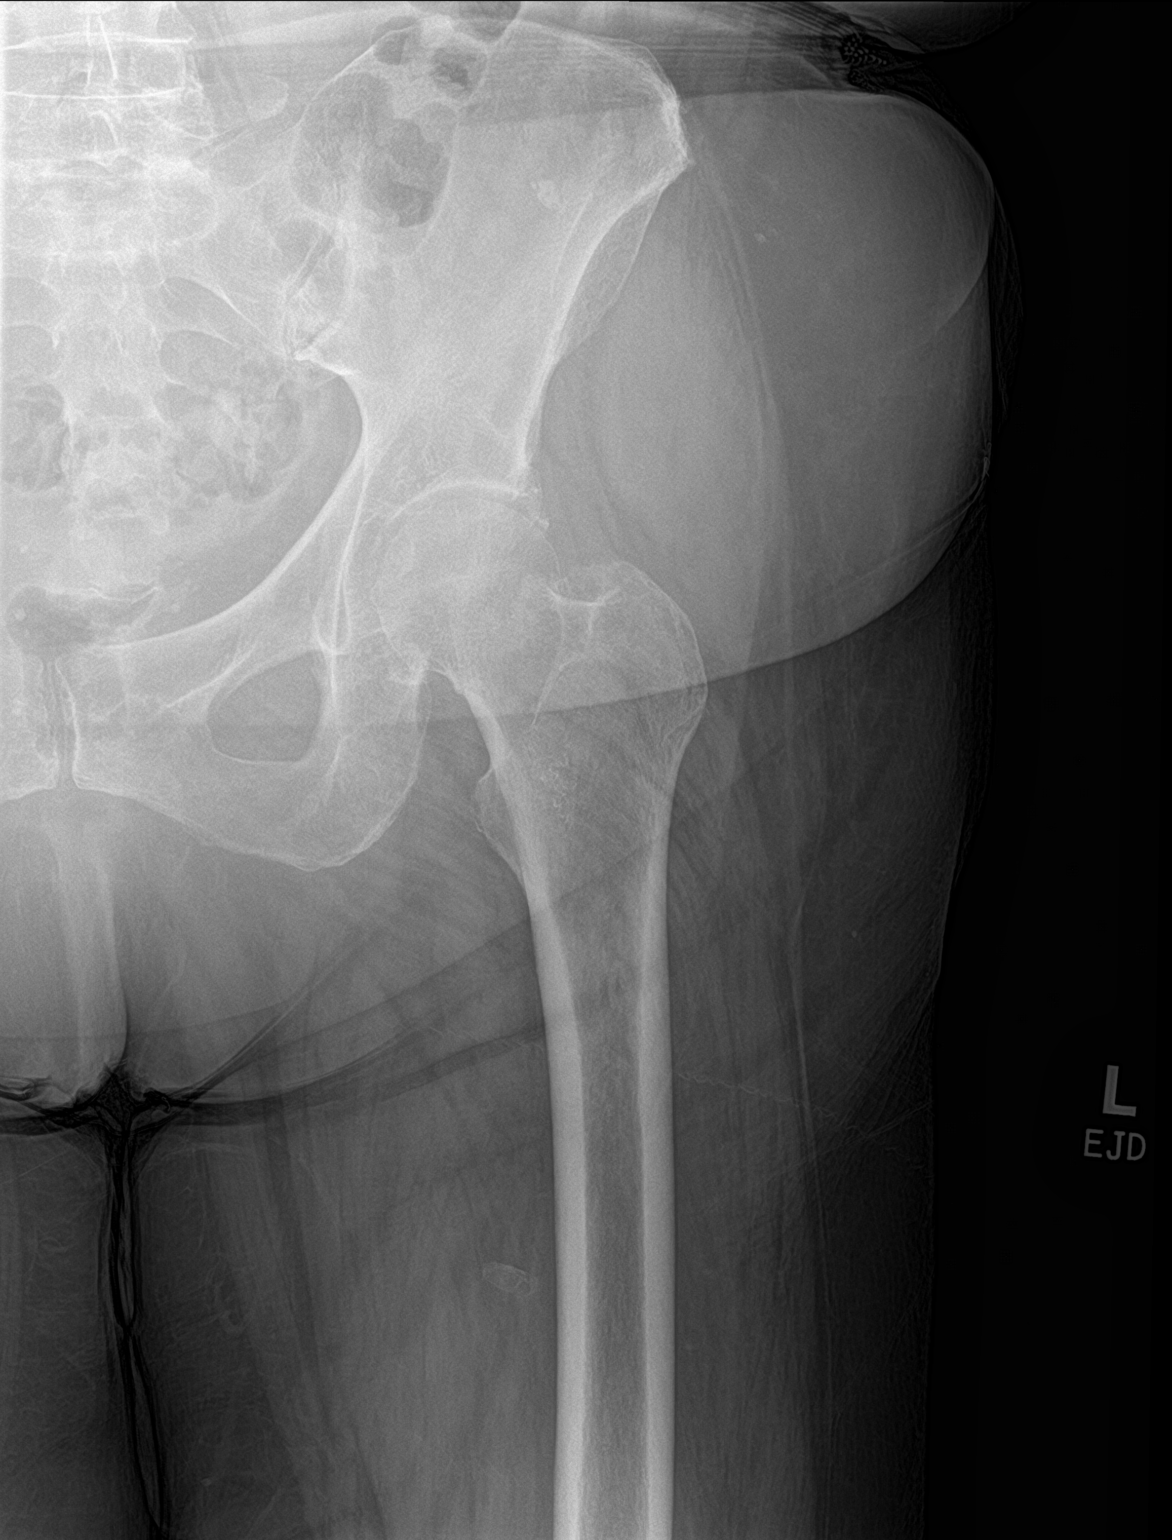

[hip x-table]
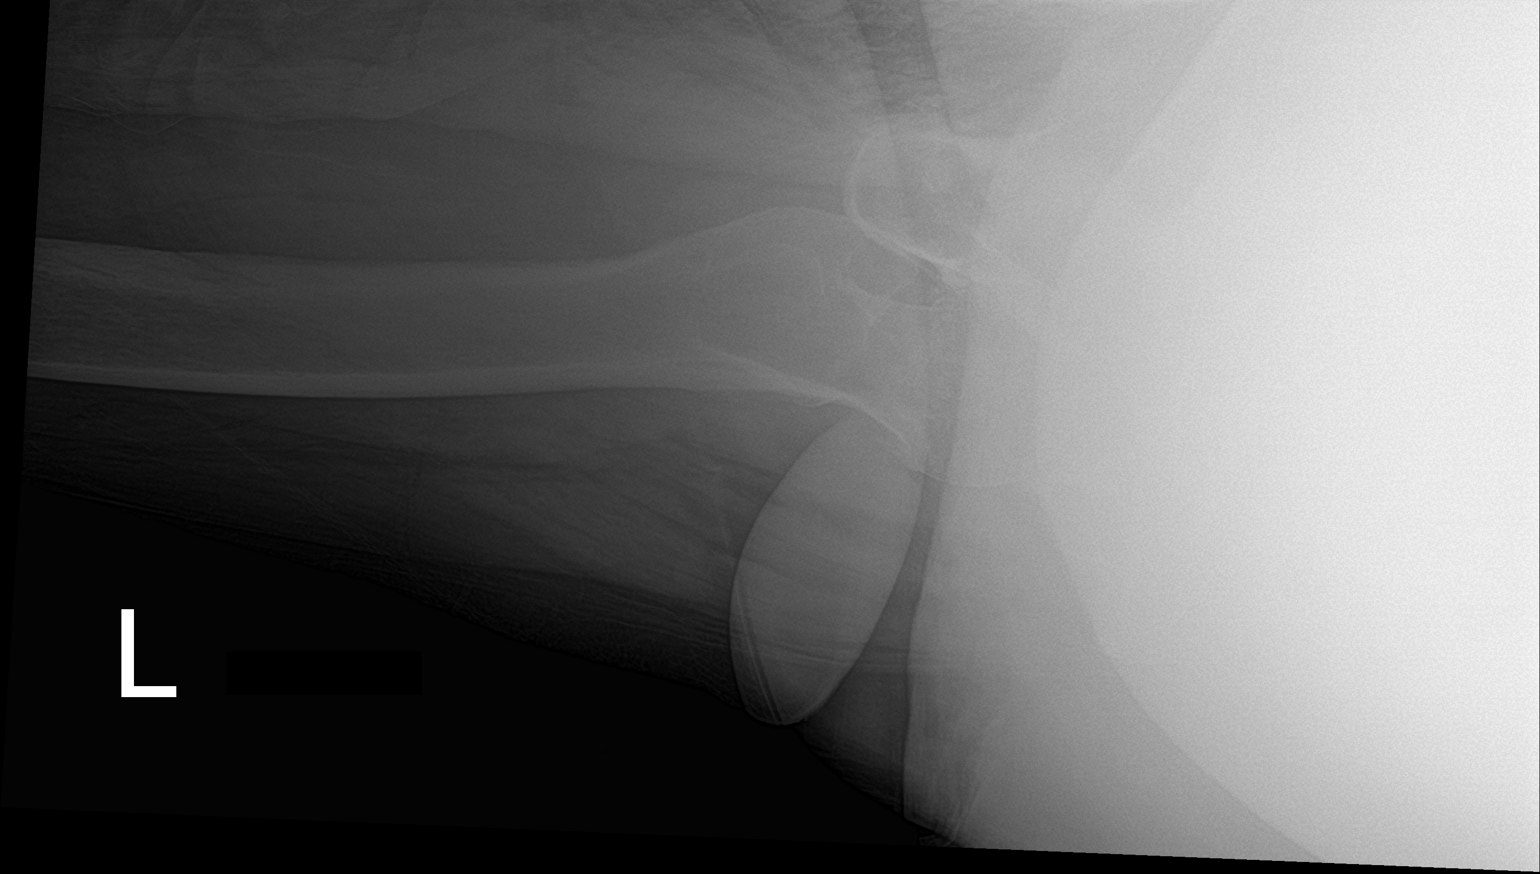

[3 of 3 positions shown; findings below may reference images not displayed]

FINDINGS: Again seen acute minimally displaced femoral neck fracture. The
femoral head remains seated. No change in alignment from prior exam.
Pubic rami are intact. Pubic symphyseal chondrocalcinosis.
IMPRESSION: Acute minimally displaced left femoral neck fracture, unchanged in
alignment from earlier today.

## 2022-10-23 IMAGING — RF DG HIP (WITH OR WITHOUT PELVIS) 2-3V*L*
1 series · 3 of 3 positions shown · non-contrast
Comparison: 04/03/2022

CLINICAL DATA: Left total hip arthroplasty, anterior approach.

EXAM:
DG HIP (WITH OR WITHOUT PELVIS) 2-3V LEFT

[Series 1: dg x-ray · 0.20mm/px · 3 of 3 slices shown]
[im 1/3]
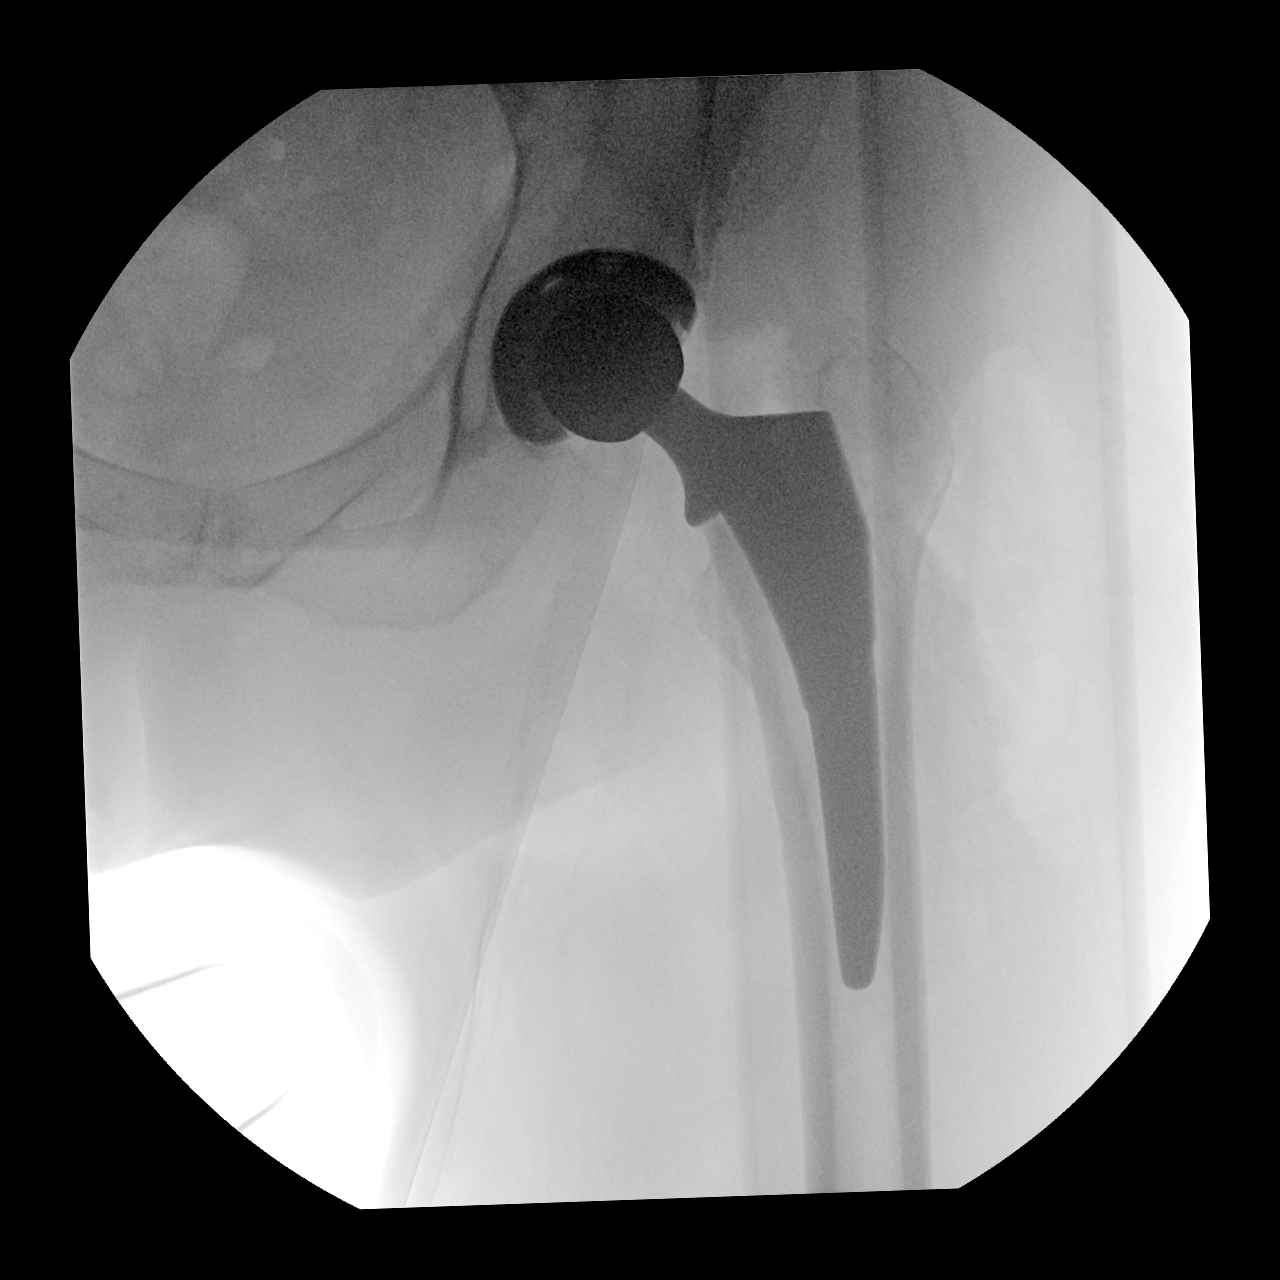
[im 2/3]
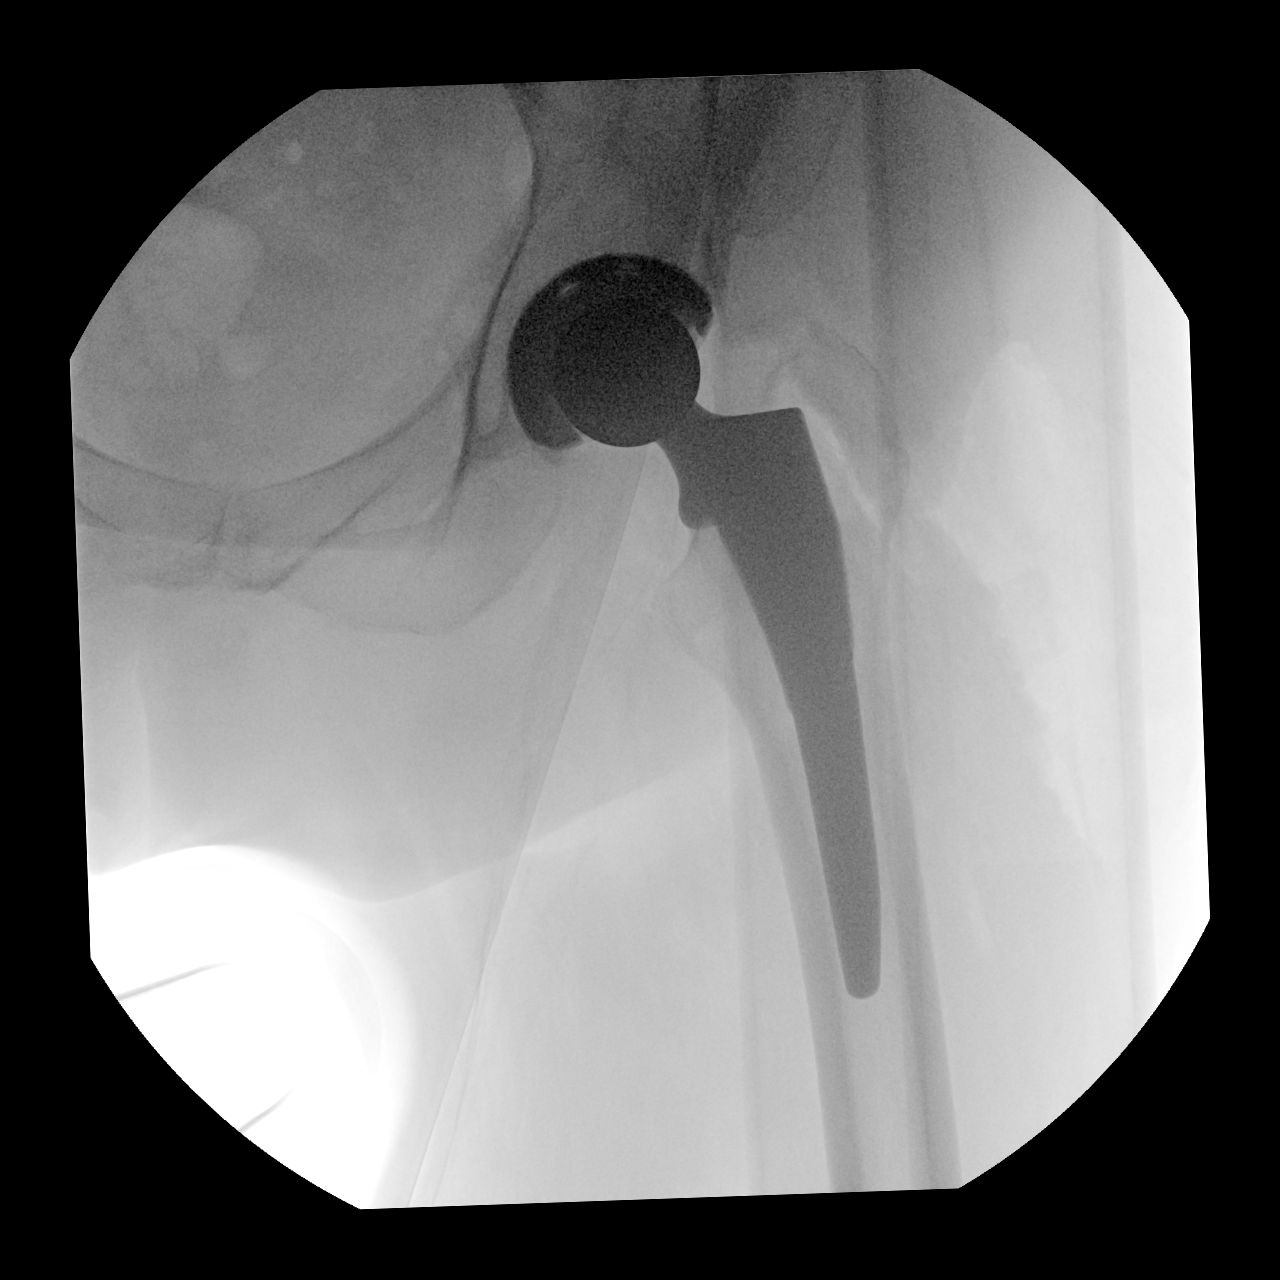
[im 3/3]
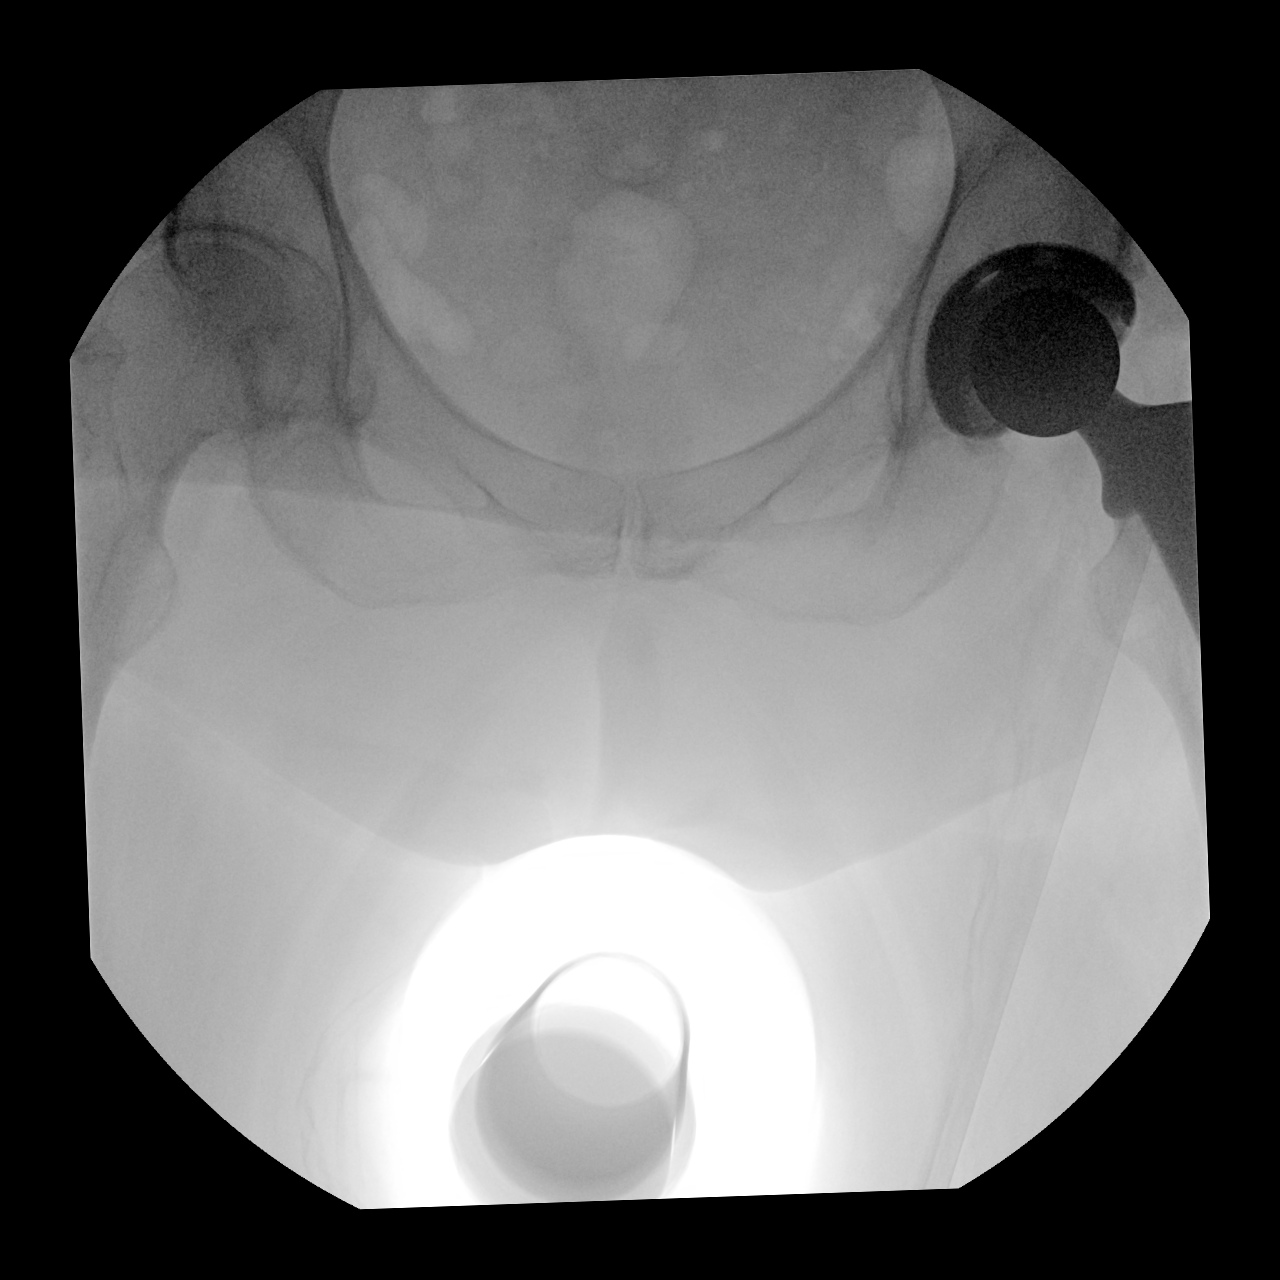

[3 of 3 positions shown; findings below may reference images not displayed]

FINDINGS: Intraoperative fluoroscopy is utilized for surgical control
purposes. Fluoroscopy time is indicated at 22.3 seconds. Dose,
cumulative air kerma was 2.6294 mGy. Three spot fluoroscopic images
were obtained.

Spot fluoroscopic images obtained demonstrate interval placement of
a left total hip arthroplasty using non cemented components.
Components appear well seated.
IMPRESSION: Intraoperative fluoroscopy utilized for surgical control purposes
demonstrating placement of a left total hip arthroplasty.

## 2022-10-23 IMAGING — DX DG PORTABLE PELVIS
1 series · 1 of 1 positions shown · non-contrast
Comparison: None Available.

CLINICAL DATA: Left hip replacement

EXAM:
PORTABLE PELVIS 1-2 VIEWS

[pelvis]
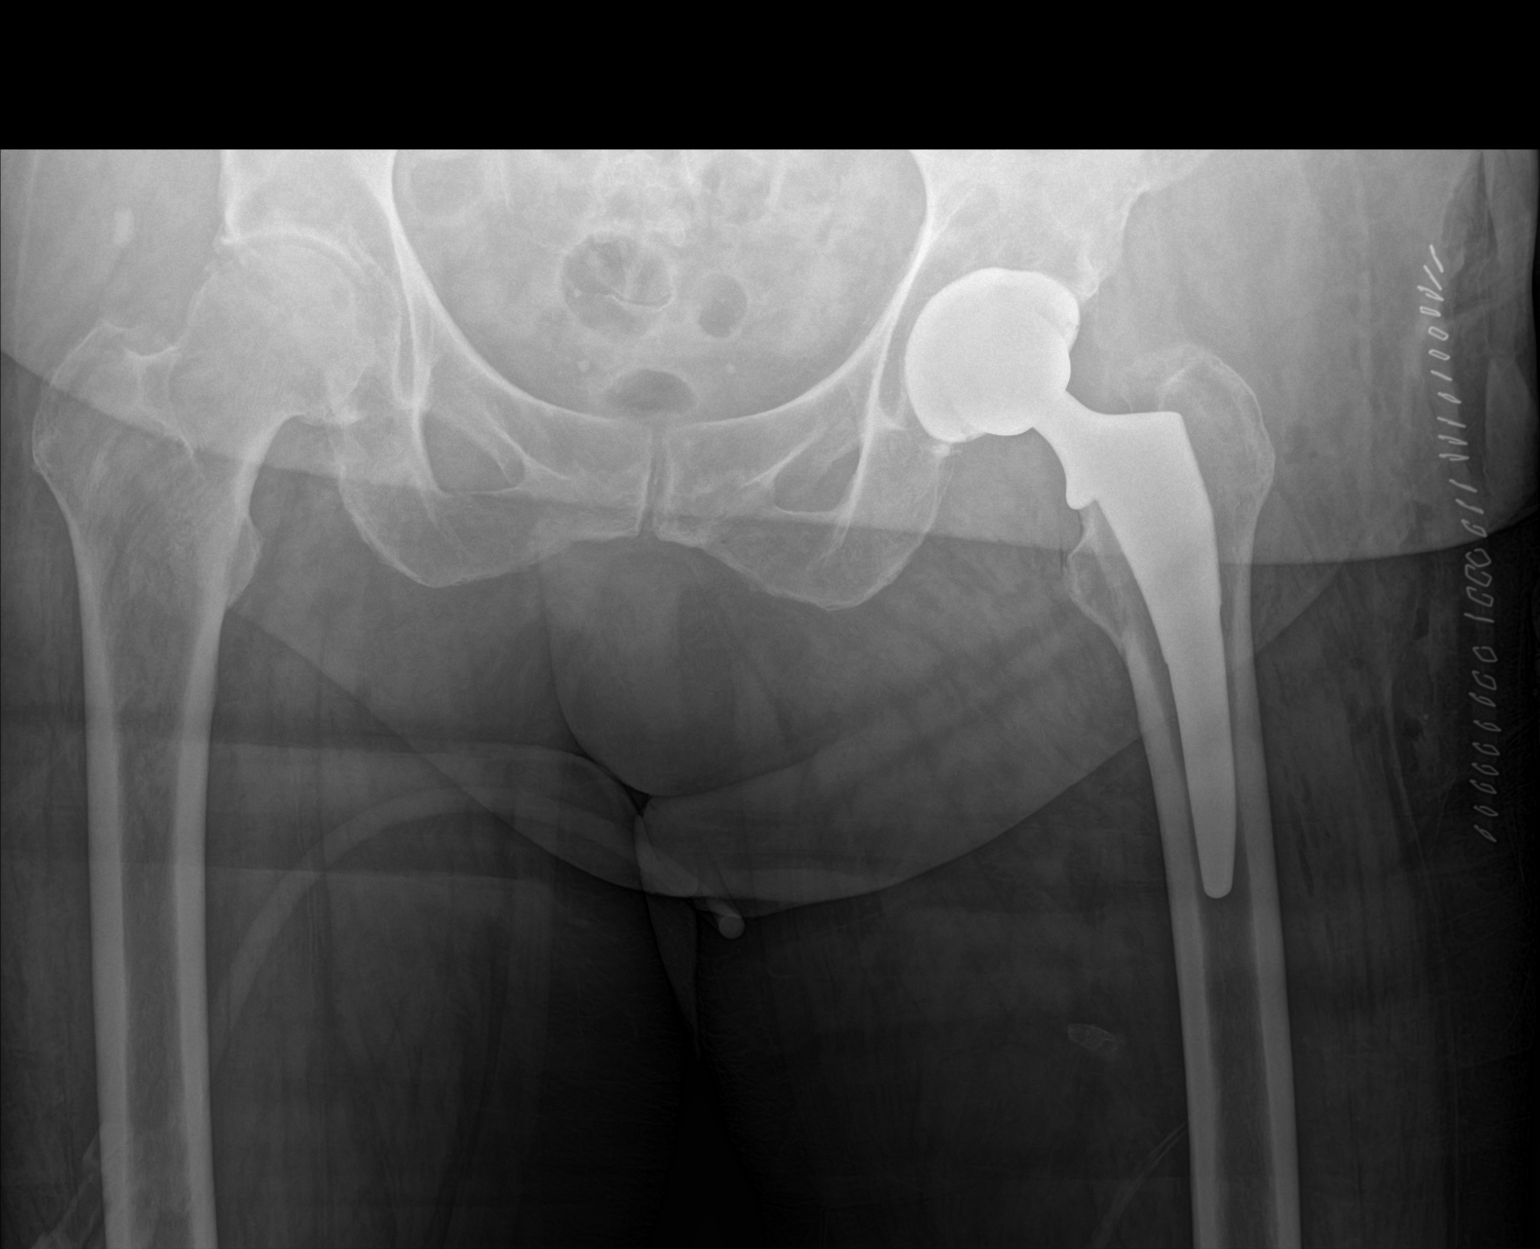

[1 of 1 positions shown; findings below may reference images not displayed]

FINDINGS: Orthopedic view of the pelvis demonstrates surgical changes of left
total hip arthroplasty. Arthroplasty components overlie the expected
position. Normal overall alignment. No unexpected fracture or
dislocation. Mild right hip degenerative arthritis noted. Skin
staples are seen lateral to the left hip.
IMPRESSION: Status post left total hip arthroplasty. No unexpected fracture or
dislocation.

## 2022-10-24 DIAGNOSIS — M1991 Primary osteoarthritis, unspecified site: Secondary | ICD-10-CM | POA: Diagnosis not present

## 2022-10-30 DIAGNOSIS — R0982 Postnasal drip: Secondary | ICD-10-CM | POA: Diagnosis not present

## 2022-10-30 DIAGNOSIS — J309 Allergic rhinitis, unspecified: Secondary | ICD-10-CM | POA: Diagnosis not present

## 2022-11-07 DIAGNOSIS — E538 Deficiency of other specified B group vitamins: Secondary | ICD-10-CM | POA: Diagnosis not present

## 2022-11-11 DIAGNOSIS — Z1231 Encounter for screening mammogram for malignant neoplasm of breast: Secondary | ICD-10-CM | POA: Diagnosis not present

## 2022-12-17 ENCOUNTER — Ambulatory Visit: Payer: Medicare Other | Admitting: Orthopaedic Surgery

## 2022-12-25 ENCOUNTER — Encounter: Payer: Self-pay | Admitting: Radiology

## 2023-01-07 ENCOUNTER — Ambulatory Visit: Payer: Medicare Other | Admitting: Orthopaedic Surgery

## 2023-01-28 ENCOUNTER — Ambulatory Visit: Payer: Medicare Other | Admitting: Orthopaedic Surgery

## 2023-02-03 DIAGNOSIS — J329 Chronic sinusitis, unspecified: Secondary | ICD-10-CM | POA: Diagnosis not present

## 2023-02-03 DIAGNOSIS — E781 Pure hyperglyceridemia: Secondary | ICD-10-CM | POA: Diagnosis not present

## 2023-02-03 DIAGNOSIS — K2100 Gastro-esophageal reflux disease with esophagitis, without bleeding: Secondary | ICD-10-CM | POA: Diagnosis not present

## 2023-02-03 DIAGNOSIS — J40 Bronchitis, not specified as acute or chronic: Secondary | ICD-10-CM | POA: Diagnosis not present

## 2023-02-03 DIAGNOSIS — E1169 Type 2 diabetes mellitus with other specified complication: Secondary | ICD-10-CM | POA: Diagnosis not present

## 2023-02-18 ENCOUNTER — Other Ambulatory Visit (INDEPENDENT_AMBULATORY_CARE_PROVIDER_SITE_OTHER): Payer: Medicare Other

## 2023-02-18 ENCOUNTER — Ambulatory Visit: Payer: Medicare Other | Admitting: Orthopaedic Surgery

## 2023-02-18 ENCOUNTER — Encounter: Payer: Self-pay | Admitting: Orthopaedic Surgery

## 2023-02-18 DIAGNOSIS — M25561 Pain in right knee: Secondary | ICD-10-CM | POA: Diagnosis not present

## 2023-02-18 DIAGNOSIS — Z96642 Presence of left artificial hip joint: Secondary | ICD-10-CM

## 2023-02-18 NOTE — Progress Notes (Signed)
The patient is 11 months status post a left total hip arthroplasty to treat an acute left hip femoral neck fracture.  She is very active and doing great with her left hip.  She has a history of a right total knee arthroplasty done by one of my colleagues in town that is done well.  She twisted her left knee the other night and has been hurting her for some time now.  This was hurting before she twisted it.  She is requesting a steroid injection in her right knee today and I agree with this as well.  On exam her left hip moves smoothly and fluidly.  Her left total knee looks great.  Her right knee does have pain in the medial aspect of the knee and there is no locking and catching and no effusion but is definitely painful throughout the arc of motion of that left knee.  An AP pelvis and lateral of the left hip shows a well-seated total hip arthroplasty.   I did place a steroid injection in her right knee today without difficulty.  Will see her back in 4 weeks to see how she is doing overall.  We can always x-ray that knee at her next visit if it still bothering her.  This will be an x-ray of her right knee.

## 2023-02-20 DIAGNOSIS — Z01818 Encounter for other preprocedural examination: Secondary | ICD-10-CM | POA: Diagnosis not present

## 2023-02-20 DIAGNOSIS — H353131 Nonexudative age-related macular degeneration, bilateral, early dry stage: Secondary | ICD-10-CM | POA: Diagnosis not present

## 2023-02-20 DIAGNOSIS — H25811 Combined forms of age-related cataract, right eye: Secondary | ICD-10-CM | POA: Diagnosis not present

## 2023-02-20 DIAGNOSIS — H25813 Combined forms of age-related cataract, bilateral: Secondary | ICD-10-CM | POA: Diagnosis not present

## 2023-02-25 DIAGNOSIS — M25561 Pain in right knee: Secondary | ICD-10-CM | POA: Diagnosis not present

## 2023-02-25 DIAGNOSIS — R300 Dysuria: Secondary | ICD-10-CM | POA: Diagnosis not present

## 2023-03-03 DIAGNOSIS — H259 Unspecified age-related cataract: Secondary | ICD-10-CM | POA: Diagnosis not present

## 2023-03-03 DIAGNOSIS — Z6830 Body mass index (BMI) 30.0-30.9, adult: Secondary | ICD-10-CM | POA: Diagnosis not present

## 2023-03-03 DIAGNOSIS — J45909 Unspecified asthma, uncomplicated: Secondary | ICD-10-CM | POA: Diagnosis not present

## 2023-03-03 DIAGNOSIS — H25811 Combined forms of age-related cataract, right eye: Secondary | ICD-10-CM | POA: Diagnosis not present

## 2023-03-03 DIAGNOSIS — E669 Obesity, unspecified: Secondary | ICD-10-CM | POA: Diagnosis not present

## 2023-03-03 DIAGNOSIS — E1122 Type 2 diabetes mellitus with diabetic chronic kidney disease: Secondary | ICD-10-CM | POA: Diagnosis not present

## 2023-03-03 DIAGNOSIS — H353131 Nonexudative age-related macular degeneration, bilateral, early dry stage: Secondary | ICD-10-CM | POA: Diagnosis not present

## 2023-03-03 DIAGNOSIS — N1831 Chronic kidney disease, stage 3a: Secondary | ICD-10-CM | POA: Diagnosis not present

## 2023-03-03 DIAGNOSIS — E1136 Type 2 diabetes mellitus with diabetic cataract: Secondary | ICD-10-CM | POA: Diagnosis not present

## 2023-03-03 DIAGNOSIS — I129 Hypertensive chronic kidney disease with stage 1 through stage 4 chronic kidney disease, or unspecified chronic kidney disease: Secondary | ICD-10-CM | POA: Diagnosis not present

## 2023-03-03 DIAGNOSIS — E039 Hypothyroidism, unspecified: Secondary | ICD-10-CM | POA: Diagnosis not present

## 2023-03-18 ENCOUNTER — Encounter: Payer: Self-pay | Admitting: Orthopaedic Surgery

## 2023-03-18 ENCOUNTER — Ambulatory Visit: Payer: Medicare Other | Admitting: Orthopaedic Surgery

## 2023-03-18 ENCOUNTER — Other Ambulatory Visit (INDEPENDENT_AMBULATORY_CARE_PROVIDER_SITE_OTHER): Payer: Medicare Other

## 2023-03-18 DIAGNOSIS — M25561 Pain in right knee: Secondary | ICD-10-CM | POA: Diagnosis not present

## 2023-03-18 DIAGNOSIS — G8929 Other chronic pain: Secondary | ICD-10-CM

## 2023-03-18 NOTE — Progress Notes (Signed)
The patient is well-known to me.  We replaced her left hip a year ago after a mechanical fall in which she sustained a femoral neck fracture.  My colleague in town has replaced her left knee in the past.  Her right knee has been hurting her quite a bit I did place a steroid injection in that right knee earlier this month.  She then fell a couple days later as she has been moving.  She is 72 years old.  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  She said there was no fracture in the knee is getting somewhat better but she knows we want to x-ray today to make sure that is doing well overall.  She is walking without assistive ice.  Her pain she points to the medial aspect of that right knee.  On exam her right knee does have some pain along the medial aspect of the knee but she has good range of motion and that knee is stable.  2 views of the right knee show an arthritic knee but no evidence of a fracture.  If the knee worsen she knows to let us know.  My other recommendation for that knee at some point would be considering a knee replacement.  All questions and concerns were answered and addressed.

## 2023-04-06 DIAGNOSIS — E781 Pure hyperglyceridemia: Secondary | ICD-10-CM | POA: Diagnosis not present

## 2023-04-06 DIAGNOSIS — M8580 Other specified disorders of bone density and structure, unspecified site: Secondary | ICD-10-CM | POA: Diagnosis not present

## 2023-04-06 DIAGNOSIS — E039 Hypothyroidism, unspecified: Secondary | ICD-10-CM | POA: Diagnosis not present

## 2023-04-06 DIAGNOSIS — E1169 Type 2 diabetes mellitus with other specified complication: Secondary | ICD-10-CM | POA: Diagnosis not present

## 2023-04-28 DIAGNOSIS — H25812 Combined forms of age-related cataract, left eye: Secondary | ICD-10-CM | POA: Diagnosis not present

## 2023-04-28 DIAGNOSIS — H259 Unspecified age-related cataract: Secondary | ICD-10-CM | POA: Diagnosis not present

## 2023-04-28 DIAGNOSIS — I10 Essential (primary) hypertension: Secondary | ICD-10-CM | POA: Diagnosis not present

## 2023-06-10 DIAGNOSIS — F411 Generalized anxiety disorder: Secondary | ICD-10-CM | POA: Diagnosis not present

## 2023-06-10 DIAGNOSIS — E781 Pure hyperglyceridemia: Secondary | ICD-10-CM | POA: Diagnosis not present

## 2023-06-10 DIAGNOSIS — E1169 Type 2 diabetes mellitus with other specified complication: Secondary | ICD-10-CM | POA: Diagnosis not present

## 2023-06-10 DIAGNOSIS — E039 Hypothyroidism, unspecified: Secondary | ICD-10-CM | POA: Diagnosis not present

## 2023-07-13 DIAGNOSIS — H3562 Retinal hemorrhage, left eye: Secondary | ICD-10-CM | POA: Diagnosis not present

## 2023-07-13 DIAGNOSIS — H3500 Unspecified background retinopathy: Secondary | ICD-10-CM | POA: Diagnosis not present

## 2023-07-13 DIAGNOSIS — Z961 Presence of intraocular lens: Secondary | ICD-10-CM | POA: Diagnosis not present

## 2023-08-10 DIAGNOSIS — Z79899 Other long term (current) drug therapy: Secondary | ICD-10-CM | POA: Diagnosis not present

## 2023-08-10 DIAGNOSIS — E781 Pure hyperglyceridemia: Secondary | ICD-10-CM | POA: Diagnosis not present

## 2023-08-10 DIAGNOSIS — E1169 Type 2 diabetes mellitus with other specified complication: Secondary | ICD-10-CM | POA: Diagnosis not present

## 2023-08-10 DIAGNOSIS — E785 Hyperlipidemia, unspecified: Secondary | ICD-10-CM | POA: Diagnosis not present

## 2023-08-10 DIAGNOSIS — E559 Vitamin D deficiency, unspecified: Secondary | ICD-10-CM | POA: Diagnosis not present

## 2023-08-10 DIAGNOSIS — E039 Hypothyroidism, unspecified: Secondary | ICD-10-CM | POA: Diagnosis not present

## 2023-09-07 DIAGNOSIS — Z0000 Encounter for general adult medical examination without abnormal findings: Secondary | ICD-10-CM | POA: Diagnosis not present

## 2023-09-07 DIAGNOSIS — E1169 Type 2 diabetes mellitus with other specified complication: Secondary | ICD-10-CM | POA: Diagnosis not present

## 2023-09-07 DIAGNOSIS — E781 Pure hyperglyceridemia: Secondary | ICD-10-CM | POA: Diagnosis not present

## 2023-09-07 DIAGNOSIS — Z23 Encounter for immunization: Secondary | ICD-10-CM | POA: Diagnosis not present

## 2023-09-07 DIAGNOSIS — G720 Drug-induced myopathy: Secondary | ICD-10-CM | POA: Diagnosis not present

## 2023-09-07 DIAGNOSIS — K2100 Gastro-esophageal reflux disease with esophagitis, without bleeding: Secondary | ICD-10-CM | POA: Diagnosis not present

## 2023-09-07 DIAGNOSIS — E785 Hyperlipidemia, unspecified: Secondary | ICD-10-CM | POA: Diagnosis not present

## 2023-11-10 DIAGNOSIS — R1310 Dysphagia, unspecified: Secondary | ICD-10-CM | POA: Diagnosis not present

## 2023-11-10 DIAGNOSIS — K2270 Barrett's esophagus without dysplasia: Secondary | ICD-10-CM | POA: Diagnosis not present

## 2023-11-10 DIAGNOSIS — K219 Gastro-esophageal reflux disease without esophagitis: Secondary | ICD-10-CM | POA: Diagnosis not present

## 2023-11-10 DIAGNOSIS — R194 Change in bowel habit: Secondary | ICD-10-CM | POA: Diagnosis not present

## 2023-11-23 DIAGNOSIS — H35042 Retinal micro-aneurysms, unspecified, left eye: Secondary | ICD-10-CM | POA: Diagnosis not present

## 2023-11-23 DIAGNOSIS — D6800 Von Willebrand disease, unspecified: Secondary | ICD-10-CM | POA: Diagnosis not present

## 2023-12-07 DIAGNOSIS — Z1231 Encounter for screening mammogram for malignant neoplasm of breast: Secondary | ICD-10-CM | POA: Diagnosis not present

## 2023-12-15 DIAGNOSIS — R922 Inconclusive mammogram: Secondary | ICD-10-CM | POA: Diagnosis not present

## 2024-03-31 DIAGNOSIS — E781 Pure hyperglyceridemia: Secondary | ICD-10-CM | POA: Diagnosis not present

## 2024-03-31 DIAGNOSIS — Z79899 Other long term (current) drug therapy: Secondary | ICD-10-CM | POA: Diagnosis not present

## 2024-03-31 DIAGNOSIS — E785 Hyperlipidemia, unspecified: Secondary | ICD-10-CM | POA: Diagnosis not present

## 2024-03-31 DIAGNOSIS — E1169 Type 2 diabetes mellitus with other specified complication: Secondary | ICD-10-CM | POA: Diagnosis not present

## 2024-03-31 DIAGNOSIS — E039 Hypothyroidism, unspecified: Secondary | ICD-10-CM | POA: Diagnosis not present

## 2024-03-31 DIAGNOSIS — G720 Drug-induced myopathy: Secondary | ICD-10-CM | POA: Diagnosis not present

## 2024-03-31 DIAGNOSIS — M8580 Other specified disorders of bone density and structure, unspecified site: Secondary | ICD-10-CM | POA: Diagnosis not present

## 2024-04-18 DIAGNOSIS — M9903 Segmental and somatic dysfunction of lumbar region: Secondary | ICD-10-CM | POA: Diagnosis not present

## 2024-04-18 DIAGNOSIS — M5387 Other specified dorsopathies, lumbosacral region: Secondary | ICD-10-CM | POA: Diagnosis not present

## 2024-04-18 DIAGNOSIS — M7918 Myalgia, other site: Secondary | ICD-10-CM | POA: Diagnosis not present

## 2024-04-18 DIAGNOSIS — N1832 Chronic kidney disease, stage 3b: Secondary | ICD-10-CM | POA: Diagnosis not present

## 2024-04-18 DIAGNOSIS — M9905 Segmental and somatic dysfunction of pelvic region: Secondary | ICD-10-CM | POA: Diagnosis not present

## 2024-04-26 DIAGNOSIS — M5387 Other specified dorsopathies, lumbosacral region: Secondary | ICD-10-CM | POA: Diagnosis not present

## 2024-04-26 DIAGNOSIS — M9903 Segmental and somatic dysfunction of lumbar region: Secondary | ICD-10-CM | POA: Diagnosis not present

## 2024-04-26 DIAGNOSIS — M9905 Segmental and somatic dysfunction of pelvic region: Secondary | ICD-10-CM | POA: Diagnosis not present

## 2024-04-26 DIAGNOSIS — M7918 Myalgia, other site: Secondary | ICD-10-CM | POA: Diagnosis not present

## 2024-04-28 DIAGNOSIS — M7918 Myalgia, other site: Secondary | ICD-10-CM | POA: Diagnosis not present

## 2024-04-28 DIAGNOSIS — M5387 Other specified dorsopathies, lumbosacral region: Secondary | ICD-10-CM | POA: Diagnosis not present

## 2024-04-28 DIAGNOSIS — M9903 Segmental and somatic dysfunction of lumbar region: Secondary | ICD-10-CM | POA: Diagnosis not present

## 2024-04-28 DIAGNOSIS — M9905 Segmental and somatic dysfunction of pelvic region: Secondary | ICD-10-CM | POA: Diagnosis not present

## 2024-05-03 DIAGNOSIS — M9903 Segmental and somatic dysfunction of lumbar region: Secondary | ICD-10-CM | POA: Diagnosis not present

## 2024-05-03 DIAGNOSIS — M9905 Segmental and somatic dysfunction of pelvic region: Secondary | ICD-10-CM | POA: Diagnosis not present

## 2024-05-03 DIAGNOSIS — M7918 Myalgia, other site: Secondary | ICD-10-CM | POA: Diagnosis not present

## 2024-05-03 DIAGNOSIS — M5387 Other specified dorsopathies, lumbosacral region: Secondary | ICD-10-CM | POA: Diagnosis not present

## 2024-05-12 DIAGNOSIS — M7918 Myalgia, other site: Secondary | ICD-10-CM | POA: Diagnosis not present

## 2024-05-12 DIAGNOSIS — M9905 Segmental and somatic dysfunction of pelvic region: Secondary | ICD-10-CM | POA: Diagnosis not present

## 2024-05-12 DIAGNOSIS — M5387 Other specified dorsopathies, lumbosacral region: Secondary | ICD-10-CM | POA: Diagnosis not present

## 2024-05-12 DIAGNOSIS — M9903 Segmental and somatic dysfunction of lumbar region: Secondary | ICD-10-CM | POA: Diagnosis not present

## 2024-05-19 DIAGNOSIS — M7918 Myalgia, other site: Secondary | ICD-10-CM | POA: Diagnosis not present

## 2024-05-19 DIAGNOSIS — M9903 Segmental and somatic dysfunction of lumbar region: Secondary | ICD-10-CM | POA: Diagnosis not present

## 2024-05-19 DIAGNOSIS — M5387 Other specified dorsopathies, lumbosacral region: Secondary | ICD-10-CM | POA: Diagnosis not present

## 2024-05-19 DIAGNOSIS — M9905 Segmental and somatic dysfunction of pelvic region: Secondary | ICD-10-CM | POA: Diagnosis not present

## 2024-05-23 DIAGNOSIS — H35042 Retinal micro-aneurysms, unspecified, left eye: Secondary | ICD-10-CM | POA: Diagnosis not present

## 2024-05-23 DIAGNOSIS — H3500 Unspecified background retinopathy: Secondary | ICD-10-CM | POA: Diagnosis not present

## 2024-05-26 DIAGNOSIS — M9903 Segmental and somatic dysfunction of lumbar region: Secondary | ICD-10-CM | POA: Diagnosis not present

## 2024-05-26 DIAGNOSIS — M9905 Segmental and somatic dysfunction of pelvic region: Secondary | ICD-10-CM | POA: Diagnosis not present

## 2024-05-26 DIAGNOSIS — M7918 Myalgia, other site: Secondary | ICD-10-CM | POA: Diagnosis not present

## 2024-05-26 DIAGNOSIS — M5387 Other specified dorsopathies, lumbosacral region: Secondary | ICD-10-CM | POA: Diagnosis not present

## 2024-05-26 DIAGNOSIS — N1832 Chronic kidney disease, stage 3b: Secondary | ICD-10-CM | POA: Diagnosis not present

## 2024-06-02 DIAGNOSIS — N1832 Chronic kidney disease, stage 3b: Secondary | ICD-10-CM | POA: Diagnosis not present

## 2024-06-09 DIAGNOSIS — M5387 Other specified dorsopathies, lumbosacral region: Secondary | ICD-10-CM | POA: Diagnosis not present

## 2024-06-09 DIAGNOSIS — M7918 Myalgia, other site: Secondary | ICD-10-CM | POA: Diagnosis not present

## 2024-06-09 DIAGNOSIS — M9903 Segmental and somatic dysfunction of lumbar region: Secondary | ICD-10-CM | POA: Diagnosis not present

## 2024-06-09 DIAGNOSIS — M9905 Segmental and somatic dysfunction of pelvic region: Secondary | ICD-10-CM | POA: Diagnosis not present

## 2024-06-28 DIAGNOSIS — E538 Deficiency of other specified B group vitamins: Secondary | ICD-10-CM | POA: Diagnosis not present

## 2024-06-28 DIAGNOSIS — K648 Other hemorrhoids: Secondary | ICD-10-CM | POA: Diagnosis not present

## 2024-06-28 DIAGNOSIS — K5901 Slow transit constipation: Secondary | ICD-10-CM | POA: Diagnosis not present

## 2024-06-28 DIAGNOSIS — E1169 Type 2 diabetes mellitus with other specified complication: Secondary | ICD-10-CM | POA: Diagnosis not present

## 2024-06-28 DIAGNOSIS — E039 Hypothyroidism, unspecified: Secondary | ICD-10-CM | POA: Diagnosis not present

## 2024-06-28 DIAGNOSIS — N1832 Chronic kidney disease, stage 3b: Secondary | ICD-10-CM | POA: Diagnosis not present

## 2024-06-30 DIAGNOSIS — M9905 Segmental and somatic dysfunction of pelvic region: Secondary | ICD-10-CM | POA: Diagnosis not present

## 2024-06-30 DIAGNOSIS — M5387 Other specified dorsopathies, lumbosacral region: Secondary | ICD-10-CM | POA: Diagnosis not present

## 2024-06-30 DIAGNOSIS — M7918 Myalgia, other site: Secondary | ICD-10-CM | POA: Diagnosis not present

## 2024-06-30 DIAGNOSIS — M9903 Segmental and somatic dysfunction of lumbar region: Secondary | ICD-10-CM | POA: Diagnosis not present

## 2024-07-26 DIAGNOSIS — M5387 Other specified dorsopathies, lumbosacral region: Secondary | ICD-10-CM | POA: Diagnosis not present

## 2024-07-26 DIAGNOSIS — M9903 Segmental and somatic dysfunction of lumbar region: Secondary | ICD-10-CM | POA: Diagnosis not present

## 2024-07-26 DIAGNOSIS — M7918 Myalgia, other site: Secondary | ICD-10-CM | POA: Diagnosis not present

## 2024-07-26 DIAGNOSIS — M9905 Segmental and somatic dysfunction of pelvic region: Secondary | ICD-10-CM | POA: Diagnosis not present

## 2024-08-11 DIAGNOSIS — M5387 Other specified dorsopathies, lumbosacral region: Secondary | ICD-10-CM | POA: Diagnosis not present

## 2024-08-11 DIAGNOSIS — M9903 Segmental and somatic dysfunction of lumbar region: Secondary | ICD-10-CM | POA: Diagnosis not present

## 2024-08-11 DIAGNOSIS — M7918 Myalgia, other site: Secondary | ICD-10-CM | POA: Diagnosis not present

## 2024-08-11 DIAGNOSIS — M9905 Segmental and somatic dysfunction of pelvic region: Secondary | ICD-10-CM | POA: Diagnosis not present

## 2024-08-30 DIAGNOSIS — M9903 Segmental and somatic dysfunction of lumbar region: Secondary | ICD-10-CM | POA: Diagnosis not present

## 2024-08-30 DIAGNOSIS — M7918 Myalgia, other site: Secondary | ICD-10-CM | POA: Diagnosis not present

## 2024-08-30 DIAGNOSIS — M9905 Segmental and somatic dysfunction of pelvic region: Secondary | ICD-10-CM | POA: Diagnosis not present

## 2024-08-30 DIAGNOSIS — M5387 Other specified dorsopathies, lumbosacral region: Secondary | ICD-10-CM | POA: Diagnosis not present

## 2024-09-13 DIAGNOSIS — E1169 Type 2 diabetes mellitus with other specified complication: Secondary | ICD-10-CM | POA: Diagnosis not present

## 2024-09-13 DIAGNOSIS — I10 Essential (primary) hypertension: Secondary | ICD-10-CM | POA: Diagnosis not present

## 2024-09-13 DIAGNOSIS — E781 Pure hyperglyceridemia: Secondary | ICD-10-CM | POA: Diagnosis not present

## 2024-09-13 DIAGNOSIS — E538 Deficiency of other specified B group vitamins: Secondary | ICD-10-CM | POA: Diagnosis not present

## 2024-09-13 DIAGNOSIS — L819 Disorder of pigmentation, unspecified: Secondary | ICD-10-CM | POA: Diagnosis not present

## 2024-09-13 DIAGNOSIS — E039 Hypothyroidism, unspecified: Secondary | ICD-10-CM | POA: Diagnosis not present

## 2024-09-13 DIAGNOSIS — E559 Vitamin D deficiency, unspecified: Secondary | ICD-10-CM | POA: Diagnosis not present

## 2024-09-13 DIAGNOSIS — Z0000 Encounter for general adult medical examination without abnormal findings: Secondary | ICD-10-CM | POA: Diagnosis not present

## 2024-09-13 DIAGNOSIS — E785 Hyperlipidemia, unspecified: Secondary | ICD-10-CM | POA: Diagnosis not present

## 2024-09-13 DIAGNOSIS — Z79899 Other long term (current) drug therapy: Secondary | ICD-10-CM | POA: Diagnosis not present

## 2024-09-14 DIAGNOSIS — Z23 Encounter for immunization: Secondary | ICD-10-CM | POA: Diagnosis not present

## 2024-09-20 DIAGNOSIS — L570 Actinic keratosis: Secondary | ICD-10-CM | POA: Diagnosis not present

## 2024-09-20 DIAGNOSIS — M5387 Other specified dorsopathies, lumbosacral region: Secondary | ICD-10-CM | POA: Diagnosis not present

## 2024-09-20 DIAGNOSIS — M7918 Myalgia, other site: Secondary | ICD-10-CM | POA: Diagnosis not present

## 2024-09-20 DIAGNOSIS — D485 Neoplasm of uncertain behavior of skin: Secondary | ICD-10-CM | POA: Diagnosis not present

## 2024-09-20 DIAGNOSIS — M9905 Segmental and somatic dysfunction of pelvic region: Secondary | ICD-10-CM | POA: Diagnosis not present

## 2024-09-20 DIAGNOSIS — M9903 Segmental and somatic dysfunction of lumbar region: Secondary | ICD-10-CM | POA: Diagnosis not present

## 2024-09-22 DIAGNOSIS — K080 Exfoliation of teeth due to systemic causes: Secondary | ICD-10-CM | POA: Diagnosis not present

## 2024-09-27 DIAGNOSIS — K080 Exfoliation of teeth due to systemic causes: Secondary | ICD-10-CM | POA: Diagnosis not present

## 2024-10-11 DIAGNOSIS — K080 Exfoliation of teeth due to systemic causes: Secondary | ICD-10-CM | POA: Diagnosis not present

## 2024-11-03 NOTE — Progress Notes (Signed)
 Christina Garner                                          MRN: [id number]   11/03/2024   The [facility] Quality Team Specialist reviewed this patient medical record for the purposes of chart review for care gap closure. The following were reviewed: erroneous encounter.    [facility] Quality Team
